# Patient Record
Sex: Female | Born: 1940
Health system: Southern US, Community
[De-identification: ages and names within clinical notes are randomized; demographics above are authoritative.]

## PROBLEM LIST (undated history)

## (undated) DIAGNOSIS — T7840XA Allergy, unspecified, initial encounter: Secondary | ICD-10-CM

## (undated) DIAGNOSIS — Z9889 Other specified postprocedural states: Secondary | ICD-10-CM

## (undated) DIAGNOSIS — R112 Nausea with vomiting, unspecified: Secondary | ICD-10-CM

## (undated) DIAGNOSIS — J449 Chronic obstructive pulmonary disease, unspecified: Secondary | ICD-10-CM

## (undated) DIAGNOSIS — K635 Polyp of colon: Secondary | ICD-10-CM

## (undated) DIAGNOSIS — I509 Heart failure, unspecified: Secondary | ICD-10-CM

## (undated) HISTORY — PX: THUMB ARTHROSCOPY: SHX2509

## (undated) HISTORY — DX: Allergy, unspecified, initial encounter: T78.40XA

## (undated) HISTORY — DX: Polyp of colon: K63.5

## (undated) HISTORY — PX: PARTIAL HYSTERECTOMY: SHX80

## (undated) HISTORY — PX: KNEE SURGERY: SHX244

---

## 2013-03-13 ENCOUNTER — Ambulatory Visit: Payer: Self-pay | Admitting: Ophthalmology

## 2015-01-09 ENCOUNTER — Ambulatory Visit
Admit: 2015-01-09 | Disposition: A | Discharge status: Home / Self Care | Payer: Self-pay | Attending: Hematology and Oncology | Admitting: Hematology and Oncology

## 2015-01-09 LAB — CBC CANCER CENTER
Bands: 1 %
Basophil #: 0 x10 3/mm (ref 0.0–0.1)
Basophil %: 0.5 %
Basophil: 1 %
Comment - H1-Com1: NORMAL
Eosinophil #: 0.3 x10 3/mm (ref 0.0–0.7)
Eosinophil %: 4.3 %
Eosinophil: 3 %
HCT: 43 % (ref 35.0–47.0)
HGB: 14.7 g/dL (ref 12.0–16.0)
Lymphocyte #: 2.2 x10 3/mm (ref 1.0–3.6)
Lymphocyte %: 34 %
Lymphocytes: 33 %
MCH: 33.3 pg (ref 26.0–34.0)
MCHC: 34.2 g/dL (ref 32.0–36.0)
MCV: 97 fL (ref 80–100)
Monocyte #: 0.5 x10 3/mm (ref 0.2–0.9)
Monocyte %: 8.3 %
Monocytes: 10 %
Neutrophil #: 3.4 x10 3/mm (ref 1.4–6.5)
Neutrophil %: 52.9 %
Platelet: 128 x10 3/mm — ABNORMAL LOW (ref 150–440)
RBC: 4.41 10*6/uL (ref 3.80–5.20)
RDW: 12.4 % (ref 11.5–14.5)
Segmented Neutrophils: 48 %
Variant Lymphocyte: 4 %
WBC: 6.4 x10 3/mm (ref 3.6–11.0)

## 2015-01-09 LAB — BASIC METABOLIC PANEL
Anion Gap: 7 (ref 7–16)
BUN: 12 mg/dL
Calcium, Total: 9.4 mg/dL
Chloride: 105 mmol/L
Co2: 28 mmol/L
Creatinine: 0.76 mg/dL
EGFR (African American): >60
EGFR (Non-African Amer.): >60
Glucose: 100 mg/dL — ABNORMAL HIGH
Potassium: 3.9 mmol/L
Sodium: 140 mmol/L

## 2015-01-09 LAB — PROTIME-INR
INR: 1
Prothrombin Time: 13.1 secs

## 2015-01-09 LAB — APTT: Activated PTT: 26.2 secs (ref 23.6–35.9)

## 2015-01-09 LAB — TSH: Thyroid Stimulating Horm: 3.494 u[IU]/mL

## 2015-01-09 LAB — FOLATE: Folic Acid: 19.8 ng/mL

## 2015-01-12 LAB — URINE IEP, RANDOM

## 2015-01-12 LAB — PROT IMMUNOELECTROPHORES(ARMC)

## 2015-01-14 ENCOUNTER — Other Ambulatory Visit: Payer: Self-pay | Admitting: Family Medicine

## 2015-01-14 DIAGNOSIS — Z1231 Encounter for screening mammogram for malignant neoplasm of breast: Secondary | ICD-10-CM

## 2015-02-03 ENCOUNTER — Ambulatory Visit: Payer: Self-pay

## 2015-02-03 ENCOUNTER — Ambulatory Visit
Admission: RE | Admit: 2015-02-03 | Discharge: 2015-02-03 | Disposition: A | Discharge status: Home / Self Care | Payer: Medicare Other | Source: Ambulatory Visit | Attending: Family Medicine | Admitting: Family Medicine

## 2015-02-03 DIAGNOSIS — Z1231 Encounter for screening mammogram for malignant neoplasm of breast: Secondary | ICD-10-CM | POA: Diagnosis present

## 2015-02-10 ENCOUNTER — Other Ambulatory Visit: Payer: Self-pay | Admitting: Family Medicine

## 2015-02-10 DIAGNOSIS — R928 Other abnormal and inconclusive findings on diagnostic imaging of breast: Secondary | ICD-10-CM

## 2015-02-12 ENCOUNTER — Ambulatory Visit: Payer: Medicare Other

## 2015-02-12 ENCOUNTER — Ambulatory Visit
Admission: RE | Admit: 2015-02-12 | Discharge: 2015-02-12 | Disposition: A | Discharge status: Home / Self Care | Payer: Medicare Other | Source: Ambulatory Visit | Attending: Family Medicine | Admitting: Family Medicine

## 2015-02-12 ENCOUNTER — Other Ambulatory Visit: Payer: Medicare Other

## 2015-02-12 DIAGNOSIS — R928 Other abnormal and inconclusive findings on diagnostic imaging of breast: Secondary | ICD-10-CM

## 2015-02-12 DIAGNOSIS — R922 Inconclusive mammogram: Secondary | ICD-10-CM | POA: Diagnosis present

## 2015-03-25 ENCOUNTER — Other Ambulatory Visit: Payer: Self-pay | Admitting: Family Medicine

## 2015-03-25 DIAGNOSIS — R9389 Abnormal findings on diagnostic imaging of other specified body structures: Secondary | ICD-10-CM

## 2015-03-25 DIAGNOSIS — Z720 Tobacco use: Secondary | ICD-10-CM

## 2015-04-01 ENCOUNTER — Ambulatory Visit
Admission: RE | Admit: 2015-04-01 | Discharge: 2015-04-01 | Disposition: A | Discharge status: Home / Self Care | Payer: Medicare Other | Source: Ambulatory Visit | Attending: Family Medicine | Admitting: Family Medicine

## 2015-04-01 DIAGNOSIS — J439 Emphysema, unspecified: Secondary | ICD-10-CM | POA: Insufficient documentation

## 2015-04-01 DIAGNOSIS — K760 Fatty (change of) liver, not elsewhere classified: Secondary | ICD-10-CM | POA: Insufficient documentation

## 2015-04-01 DIAGNOSIS — Z720 Tobacco use: Secondary | ICD-10-CM | POA: Diagnosis present

## 2015-04-01 DIAGNOSIS — R938 Abnormal findings on diagnostic imaging of other specified body structures: Secondary | ICD-10-CM | POA: Diagnosis present

## 2015-04-01 DIAGNOSIS — R9389 Abnormal findings on diagnostic imaging of other specified body structures: Secondary | ICD-10-CM

## 2015-04-02 DIAGNOSIS — R911 Solitary pulmonary nodule: Secondary | ICD-10-CM | POA: Insufficient documentation

## 2015-04-02 DIAGNOSIS — J432 Centrilobular emphysema: Secondary | ICD-10-CM | POA: Insufficient documentation

## 2015-04-02 DIAGNOSIS — K760 Fatty (change of) liver, not elsewhere classified: Secondary | ICD-10-CM | POA: Insufficient documentation

## 2015-04-16 ENCOUNTER — Other Ambulatory Visit: Payer: Self-pay

## 2015-04-16 DIAGNOSIS — D696 Thrombocytopenia, unspecified: Secondary | ICD-10-CM | POA: Insufficient documentation

## 2015-04-17 ENCOUNTER — Inpatient Hospital Stay: Payer: Medicare Other | Attending: Hematology and Oncology

## 2015-07-23 ENCOUNTER — Inpatient Hospital Stay: Payer: Medicare Other | Attending: Hematology and Oncology

## 2015-07-23 ENCOUNTER — Inpatient Hospital Stay: Payer: Medicare Other | Admitting: Hematology and Oncology

## 2016-02-26 ENCOUNTER — Ambulatory Visit: Payer: Medicare Other

## 2016-02-26 ENCOUNTER — Other Ambulatory Visit: Payer: Self-pay | Admitting: Family Medicine

## 2016-02-26 DIAGNOSIS — R51 Headache: Principal | ICD-10-CM

## 2016-02-26 DIAGNOSIS — R519 Headache, unspecified: Secondary | ICD-10-CM

## 2016-03-01 ENCOUNTER — Ambulatory Visit
Admission: RE | Admit: 2016-03-01 | Discharge: 2016-03-01 | Disposition: A | Discharge status: Home / Self Care | Payer: Medicare Other | Source: Ambulatory Visit | Attending: Family Medicine | Admitting: Family Medicine

## 2016-03-01 DIAGNOSIS — J329 Chronic sinusitis, unspecified: Secondary | ICD-10-CM | POA: Insufficient documentation

## 2016-03-01 DIAGNOSIS — R9089 Other abnormal findings on diagnostic imaging of central nervous system: Secondary | ICD-10-CM | POA: Diagnosis not present

## 2016-03-01 DIAGNOSIS — I6782 Cerebral ischemia: Secondary | ICD-10-CM | POA: Insufficient documentation

## 2016-03-01 DIAGNOSIS — R51 Headache: Secondary | ICD-10-CM | POA: Insufficient documentation

## 2016-03-01 DIAGNOSIS — R519 Headache, unspecified: Secondary | ICD-10-CM

## 2017-01-31 ENCOUNTER — Other Ambulatory Visit: Payer: Self-pay | Admitting: Family Medicine

## 2017-01-31 DIAGNOSIS — R911 Solitary pulmonary nodule: Secondary | ICD-10-CM

## 2017-01-31 DIAGNOSIS — J449 Chronic obstructive pulmonary disease, unspecified: Secondary | ICD-10-CM | POA: Insufficient documentation

## 2017-02-06 ENCOUNTER — Ambulatory Visit: Payer: Medicare Other

## 2017-09-26 ENCOUNTER — Telehealth: Payer: Self-pay | Admitting: Nurse Practitioner

## 2017-09-26 ENCOUNTER — Other Ambulatory Visit: Payer: Self-pay

## 2017-09-26 ENCOUNTER — Ambulatory Visit: Payer: Self-pay | Admitting: Nurse Practitioner

## 2017-09-26 ENCOUNTER — Encounter: Payer: Self-pay | Admitting: Nurse Practitioner

## 2017-09-26 VITALS — BP 117/60 | HR 68 | Temp 97.8°F | Resp 18 | Ht 66.5 in | Wt 177.2 lb

## 2017-09-26 DIAGNOSIS — Z1231 Encounter for screening mammogram for malignant neoplasm of breast: Secondary | ICD-10-CM | POA: Diagnosis not present

## 2017-09-26 DIAGNOSIS — Z7689 Persons encountering health services in other specified circumstances: Secondary | ICD-10-CM | POA: Diagnosis not present

## 2017-09-26 DIAGNOSIS — J410 Simple chronic bronchitis: Secondary | ICD-10-CM

## 2017-09-26 DIAGNOSIS — Z1239 Encounter for other screening for malignant neoplasm of breast: Secondary | ICD-10-CM

## 2017-09-26 MED ORDER — BUDESONIDE-FORMOTEROL FUMARATE 80-4.5 MCG/ACT IN AERO: 2.0000 | INHALATION_SPRAY | Freq: Two times a day (BID) | RESPIRATORY_TRACT | 3 refills | Status: DC

## 2017-09-26 MED ORDER — FLUTICASONE-SALMETEROL 100-50 MCG/DOSE IN AEPB: 1.0000 | INHALATION_SPRAY | Freq: Two times a day (BID) | RESPIRATORY_TRACT | 3 refills | Status: DC

## 2017-09-26 NOTE — Telephone Encounter (Signed)
Sent Advair instead.  Advair is tier 2 instead of tier 3 and is the lowest tier drug available for her.

## 2017-09-26 NOTE — Progress Notes (Signed)
Subjective:    Patient ID: Kristine Le, female    DOB: 1941/08/31, 77 y.o.   MRN: [id number]  Kristine Le is a 77 y.o. female presenting on 09/26/2017 for Establish Care [facility] clinic [place].chest congestion, coughing, ear fullness, and nasal congestion x 8 days )   HPI Establish Care New Provider Pt last seen by PCP at [facility] several months ago.  Obtain records from Care Everywhere.   Health maintenance No physical since 2015 Frequent bronchitis: CT scan nodules - scar tissue in past Mammogram: 2016 Colonoscopy: 2016 Last PAP: 1980s - but pt has had hysterectomy  Chest Congestion Sinus problems since her early 10s.  Pt reports chest congestion, cough, ear fullness, nasal congestion x last 8 days.  She is not currently having fever.   - Started mucinex and is improving some, but symptoms have continued to persist. - Stress incontinence is worse with coughing and she is not able to sleep well. - Pt has used albuterol inhaler with some relief, but continues to have chest tightness.  Past Medical History:  Diagnosis Date  . Allergy   . Colon polyps    Past Surgical History:  Procedure Laterality Date  . KNEE SURGERY    . PARTIAL HYSTERECTOMY    . THUMB ARTHROSCOPY     Social History   Socioeconomic History  . Marital status: Widowed    Spouse name: Not on file  . Number of children: Not on file  . Years of education: Not on file  . Highest education level: Not on file  Social Needs  . Financial resource strain: Not very hard  . Food insecurity - worry: Never true  . Food insecurity - inability: Never true  . Transportation needs - medical: No  . Transportation needs - non-medical: No  Occupational History  . Not on file  Tobacco Use  . Smoking status: Current Every Day Smoker    Packs/day: 1.00    Years: 60.00    Pack years: 60.00    Types: Cigarettes  . Smokeless tobacco: Current User  . Tobacco comment: she quit for 8 years in 1980s and would  like to quit.  Meds not effective in past.  Substance and Sexual Activity  . Alcohol use: Yes    Alcohol/week: 3.6 oz    Types: 6 Cans of beer per week    Comment: has liquor occasioanlly   . Drug use: No  . Sexual activity: Not on file  Other Topics Concern  . Not on file  Social History Narrative  . Not on file   Family History  Problem Relation Age of Onset  . Breast cancer Cousin        maternal  . Cancer Father        renal cancer  . Asthma Mother   . Arthritis Sister   . GER disease Sister   . Diabetes Sister    Current Outpatient Medications on File Prior to Visit  Medication Sig  . cetirizine (ZYRTEC) 10 MG tablet Take by mouth.  Marland Kitchen guaiFENesin (MUCINEX) 600 MG 12 hr tablet Take by mouth 2 (two) times daily.  Marland Kitchen ibuprofen (ADVIL,MOTRIN) 200 MG tablet Take by mouth.   No current facility-administered medications on file prior to visit.     Review of Systems  Constitutional: Negative.   HENT: Positive for congestion, sinus pressure and sinus pain.   Eyes: Negative.   Respiratory: Positive for cough, chest tightness, shortness of breath and wheezing.   Cardiovascular: Negative.  Gastrointestinal: Negative.   Endocrine: Negative.   Genitourinary: Negative.   Musculoskeletal: Negative.   Skin: Negative.   Allergic/Immunologic: Negative.   Neurological: Negative.   Hematological: Negative.   Psychiatric/Behavioral: Negative.    Per HPI unless specifically indicated above     Objective:    BP 117/60 (BP Location: Right Arm, Patient Position: Sitting, Cuff Size: Normal)   Pulse 68   Temp 97.8 F (36.6 C) (Oral)   Resp 18   Ht 5' 6.5" (1.689 m)   Wt 177 lb 3.2 oz (80.4 kg)   SpO2 99%   BMI 28.17 kg/m   Wt Readings from Last 3 Encounters:  10/10/17 179 lb 6.4 oz (81.4 kg)  09/26/17 177 lb 3.2 oz (80.4 kg)    Physical Exam  General - overweight, well-appearing, NAD HEENT - Normocephalic, atraumatic, PERRL, EOMI, patent nares w/o congestion, oropharynx  clear, MMM, TM normal, Ear canal normal, external ear normal w/o lesions, sinuses nontender to palpation Neck - supple, non-tender, no LAD, no thyromegaly, no carotid bruit Heart - RRR, no murmurs heard Lungs - Decreased air movement throughout all lobes, with wheezing and crackles, no rhonchi. Normal work of breathing, but difficult to take full deep breath without coughing. Extremeties - non-tender, no edema, cap refill < 2 seconds, peripheral pulses intact +2 bilaterally Skin - warm, dry Neuro - awake, alert, oriented x3, normal gait Psych - Normal mood and affect, normal behavior      Assessment & Plan:   Problem List Items Addressed This Visit    None    Visit Diagnoses    Simple chronic bronchitis (HCC)    -  Primary Consistent with URI and secondary bronchitis with symptoms improving.  Now has lingering cough and is likely chronic bronchitis w/ pt report of regular bronchitis each year in smoker of 52 pack-year smoking history.   Plan: 1.No antibiotic indicated w/ recent improvement of symptoms. - Start Mucinex-DM OTC for  7-10 days prn congestion - START symbicort 2 puffs twice daily - Continue albuterol as needed for rescue inhaler 2. Supportive care with nasal saline, warm herbal tea w/ honey for sore throat and URI symptoms. 3. Improve hydration 4. Tylenol / Motrin PRN fevers  5. Return criteria given    Breast cancer screening     Pt last mammogram > 2 years ago.  Plan: 1. Screening mammogram order placed.  Pt will call to schedule appointment.  Information given.    Encounter to establish care     [facility].  Records are reviewed in Care Everywhere.  Past medical, family, and surgical history reviewed w/ pt in clinic today.       Meds ordered this encounter  Medications  . DISCONTD: budesonide-formoterol (SYMBICORT) 80-4.5 MCG/ACT inhaler    Sig: Inhale 2 puffs into the lungs 2 (two) times daily.    Dispense:  1  Inhaler    Refill:  3    Order Specific Question:   Supervising Provider    Answer:   Smitty CordsKARAMALEGOS, ALEXANDER J [[id number]]      Follow up plan: Return for medicare wellness with Tiffany, 3 months with Elane Peabody for COPD.  Kristine McardleLauren Kaziah Krizek, DNP, AGPCNP-BC Adult Gerontology Primary Care Nurse Practitioner [facility] 10/13/2017, 1:27 PM

## 2017-09-26 NOTE — Telephone Encounter (Signed)
Pt said inhaler that was called in will cost her $40.  She asked to have something else called instead.  Her call back number is [phone]

## 2017-09-26 NOTE — Telephone Encounter (Signed)
The pt was notified. No questions or concerns. 

## 2017-09-26 NOTE — Patient Instructions (Addendum)
Kristine Le, Thank you for coming in to clinic today.  1. Your mammogram order has been placed.  Call the Scheduling phone number at [phone] to schedule your mammogram at your convenience.  You can choose to go to either location listed below.  Let the scheduler know which location you prefer.  [facility] [place] [facility] [address] [place], [place] [address] [place], [place] [id number]   2. For your foot: continue your epsom salt foot soaks in warm (not hot) water.  - IF this worsens, we can send you to podiatry.  3. You also have bronchitis today.  MOST Likely only related to your COPD.  - Continue mucinex or mucinex DM (for cough) twice daily as needed. - START symbicort 2 puffs twice daily. - Continue albuterol 1-2 puffs every 6 hours as needed for coughing or shortness of breath.  Please schedule a follow-up appointment with Wilhelmina McardleLauren Devon Pretty, AGNP. Return for medicare wellness with Tiffany, 3 months with [name] for COPD.  If you have any other questions or concerns, please feel free to call the clinic or send a message through MyChart. You may also schedule an earlier appointment if necessary.  You will receive a survey after today's visit either digitally by e-mail or paper by [employer] mail. Your experiences and feedback matter to us.  Please respond so we know how we are doing as we provide care for you.   Wilhelmina McardleLauren Exodus Kutzer, DNP, AGNP-BC Adult Gerontology Nurse Practitioner [facility], [facility]

## 2017-10-10 ENCOUNTER — Ambulatory Visit (INDEPENDENT_AMBULATORY_CARE_PROVIDER_SITE_OTHER): Payer: Medicare Other

## 2017-10-10 ENCOUNTER — Telehealth: Payer: Self-pay | Admitting: *Deleted

## 2017-10-10 VITALS — BP 140/82 | HR 70 | Temp 98.0°F | Resp 16 | Ht 67.0 in | Wt 179.4 lb

## 2017-10-10 DIAGNOSIS — Z0000 Encounter for general adult medical examination without abnormal findings: Secondary | ICD-10-CM | POA: Diagnosis not present

## 2017-10-10 DIAGNOSIS — Z87891 Personal history of nicotine dependence: Secondary | ICD-10-CM

## 2017-10-10 DIAGNOSIS — Z1231 Encounter for screening mammogram for malignant neoplasm of breast: Secondary | ICD-10-CM | POA: Diagnosis not present

## 2017-10-10 DIAGNOSIS — Z1239 Encounter for other screening for malignant neoplasm of breast: Secondary | ICD-10-CM

## 2017-10-10 DIAGNOSIS — Z122 Encounter for screening for malignant neoplasm of respiratory organs: Secondary | ICD-10-CM

## 2017-10-10 NOTE — Progress Notes (Signed)
Subjective:   Mariadelcarmen Corella is a 77 y.o. female who presents for Medicare Annual (Subsequent) preventive examination.  Review of Systems:   Cardiac Risk Factors include: advanced age (>2men, >47 women)     Objective:     Vitals: BP 140/82 (BP Location: Left Arm, Patient Position: Sitting)   Pulse 70   Temp 98 F (36.7 C) (Oral)   Resp 16   Ht 5\' 7"  (1.702 m)   Wt 179 lb 6.4 oz (81.4 kg)   BMI 28.10 kg/m   Body mass index is 28.1 kg/m.  Advanced Directives 10/10/2017  Does Patient Have a Medical Advance Directive? No  Would patient like information on creating a medical advance directive? Yes (MAU/Ambulatory/Procedural Areas - Information given)    Tobacco Social History   Tobacco Use  Smoking Status Current Every Day Smoker  . Packs/day: 1.00  . Years: 60.00  . Pack years: 60.00  . Types: Cigarettes  Smokeless Tobacco Current User     Ready to quit: No Counseling given: Yes   Clinical Intake:  Pre-visit preparation completed: Yes  Pain : No/denies pain     Nutritional Status: BMI 25 -29 Overweight Nutritional Risks: None Diabetes: No  How often do you need to have someone help you when you read instructions, pamphlets, or other written materials from your doctor or pharmacy?: 1 - Never What is the last grade level you completed in school?: high school   Interpreter Needed?: No  Information entered by :: Tiffany HIll,LPN   Past Medical History:  Diagnosis Date  . Allergy   . Colon polyps    Past Surgical History:  Procedure Laterality Date  . KNEE SURGERY    . PARTIAL HYSTERECTOMY    . THUMB ARTHROSCOPY     Family History  Problem Relation Age of Onset  . Breast cancer Cousin        maternal  . Cancer Father        renal cancer   Social History   Socioeconomic History  . Marital status: Widowed    Spouse name: None  . Number of children: None  . Years of education: None  . Highest education level: None  Social Needs  .  Financial resource strain: Not very hard  . Food insecurity - worry: Never true  . Food insecurity - inability: Never true  . Transportation needs - medical: No  . Transportation needs - non-medical: No  Occupational History  . None  Tobacco Use  . Smoking status: Current Every Day Smoker    Packs/day: 1.00    Years: 60.00    Pack years: 60.00    Types: Cigarettes  . Smokeless tobacco: Current User  Substance and Sexual Activity  . Alcohol use: Yes    Alcohol/week: 3.6 oz    Types: 6 Cans of beer per week    Comment: has liquor occasioanlly   . Drug use: No  . Sexual activity: None  Other Topics Concern  . None  Social History Narrative  . None    Outpatient Encounter Medications as of 10/10/2017  Medication Sig  . cetirizine (ZYRTEC) 10 MG tablet Take by mouth.  Marland Kitchen guaiFENesin (MUCINEX) 600 MG 12 hr tablet Take by mouth 2 (two) times daily.  Marland Kitchen ibuprofen (ADVIL,MOTRIN) 200 MG tablet Take by mouth.  . Fluticasone-Salmeterol (ADVAIR) 100-50 MCG/DOSE AEPB Inhale 1 puff into the lungs 2 (two) times daily. (Patient not taking: Reported on 10/10/2017)  . [DISCONTINUED] FLUAD 0.5 ML SUSY ADM 0.5ML  IM UTD  . [DISCONTINUED] Phenyleph-CPM-DM-APAP (ALKA-SELTZER PLUS COLD & COUGH) 01-18-09-325 MG CAPS Take by mouth.   No facility-administered encounter medications on file as of 10/10/2017.     Activities of Daily Living In your present state of health, do you have any difficulty performing the following activities: 10/10/2017  Hearing? N  Vision? N  Difficulty concentrating or making decisions? N  Walking or climbing stairs? N  Dressing or bathing? N  Doing errands, shopping? N  Preparing Food and eating ? N  Using the Toilet? N  In the past six months, have you accidently leaked urine? Y  Comment wears depends   Do you have problems with loss of bowel control? N  Managing your Medications? N  Managing your Finances? N  Housekeeping or managing your Housekeeping? N  Some recent  data might be hidden    Patient Care Team: Galen ManilaKennedy, Lauren Renee, NP as PCP - General (Nurse Practitioner)    Assessment:   This is a routine wellness examination for CraigmontLinda.  Exercise Activities and Dietary recommendations Current Exercise Habits: The patient does not participate in regular exercise at present, Exercise limited by: None identified  Goals    . Exercise 3x per week (30 min per time)     Recommend walking 10-15 min 3x a week     . Quit Smoking     Smoking cessation discussed       Fall Risk Fall Risk  10/10/2017  Falls in the past year? No   Is the patient's home free of loose throw rugs in walkways, pet beds, electrical cords, etc?   yes      Grab bars in the bathroom? yes      Handrails on the stairs?   yes      Adequate lighting?   yes  Timed Get Up and Go performed: completed in 8 seconds with no use of assistive devices. Steady gait. No intervention needed at this time.   Depression Screen PHQ 2/9 Scores 10/10/2017 09/26/2017  PHQ - 2 Score 0 0  PHQ- 9 Score - 0     Cognitive Function        Immunization History  Administered Date(s) Administered  . Influenza Split 06/24/2014  . Influenza-Unspecified 06/07/2012, 06/24/2015, 06/27/2017  . Pneumococcal Polysaccharide-23 06/24/2014   Found records for pneumovax 23 injection. Will check [facility] for prevnar 13. [place] [facility]   Qualifies for Shingles Vaccine?discussed shingrix vaccine   Screening Tests Health Maintenance  Topic Date Due  . PNA vac Low Risk Adult (2 of 2 - PPSV23) 06/25/2015  . DEXA SCAN  10/10/2018 (Originally 10/09/2007)  . TETANUS/TDAP  10/10/2018 (Originally 10/08/1961)  . COLONOSCOPY  05/26/2025  . INFLUENZA VACCINE  Completed    Cancer Screenings: Lung: Low Dose CT Chest recommended if Age 20-80 years, 30 pack-year currently smoking OR have quit w/in 15years. Patient does qualify.in-basket message sent to Glenna FellowsShawn perkins, RN- Oncology Navigator. Breast:  Up to date  on Mammogram? No  ordered Up to date of Bone Density/Dexa? No, declined Colorectal: completed 05/27/2015  Additional Screenings:  Hepatitis B/HIV/Syphillis: not indicated  Hepatitis C Screening: not indicated     Plan:    I have personally reviewed and addressed the Medicare Annual Wellness questionnaire and have noted the following in the patient's chart:  A. Medical and social history B. Use of alcohol, tobacco or illicit drugs  C. Current medications and supplements D. Functional ability and status E.  Nutritional status F.  Physical activity G. Advance directives H. List of other physicians I.  Hospitalizations, surgeries, and ER visits in previous 12 months J.  Vitals K. Screenings such as hearing and vision if needed, cognitive and depression L. Referrals and appointments   In addition, I have reviewed and discussed with patient certain preventive protocols, quality metrics, and best practice recommendations. A written personalized care plan for preventive services as well as general preventive health recommendations were provided to patient.   Signed,  Marin Roberts, LPN Nurse Health Advisor   Nurse Notes: none

## 2017-10-10 NOTE — Telephone Encounter (Signed)
Received referral for initial lung cancer screening scan. Contacted patient and obtained smoking history,(current, 52 pack year) as well as answering questions related to screening process. Patient denies signs of lung cancer such as weight loss or hemoptysis. Patient denies comorbidity that would prevent curative treatment if lung cancer were found. Patient is scheduled for shared decision making visit and CT scan on 10/18/17.

## 2017-10-10 NOTE — Patient Instructions (Signed)
[name] , Thank you for taking time to come for your Medicare Wellness Visit. I appreciate your ongoing commitment to your health goals. Please review the following plan we discussed and let me know if I can assist you in the future.   Screening recommendations/referrals: Colonoscopy: completed, will obtain records Mammogram: Please call [phone] to schedule your mammogram.  Bone Density: Please call [phone] to schedule  Recommended yearly ophthalmology/optometry visit for glaucoma screening and checkup Recommended yearly dental visit for hygiene and checkup  Vaccinations: Influenza vaccine: up to date Pneumococcal vaccine: pneumovax 23 done.  Tdap vaccine: due, check with your insurance company for coverage Shingles vaccine:  due, check with your insurance company for coverage  Advanced directives: Please bring a copy of your health care power of attorney and living will to the office at your convenience.  Conditions/risks identified: Smoking cessation discussed. Recommend walking 10-15 min 3x a week   Next appointment:follow up on 12/25/2017 at 8;20am with Elly ModenaLauren Kennedy,NP.  Follow up in one year for your annual wellness exam   Preventive Care 65 Years and Older, Female Preventive care refers to lifestyle choices and visits with your health care provider that can promote health and wellness. What does preventive care include?  A yearly physical exam. This is also called an annual well check.  Dental exams once or twice a year.  Routine eye exams. Ask your health care provider how often you should have your eyes checked.  Personal lifestyle choices, including:  Daily care of your teeth and gums.  Regular physical activity.  Eating a healthy diet.  Avoiding tobacco and drug use.  Limiting alcohol use.  Practicing safe sex.  Taking low-dose aspirin every day.  Taking vitamin and mineral supplements as recommended by your health care provider. What happens  during an annual well check? The services and screenings done by your health care provider during your annual well check will depend on your age, overall health, lifestyle risk factors, and family history of disease. Counseling  Your health care provider may ask you questions about your:  Alcohol use.  Tobacco use.  Drug use.  Emotional well-being.  Home and relationship well-being.  Sexual activity.  Eating habits.  History of falls.  Memory and ability to understand (cognition).  Work and work Astronomerenvironment.  Reproductive health. Screening  You may have the following tests or measurements:  Height, weight, and BMI.  Blood pressure.  Lipid and cholesterol levels. These may be checked every 5 years, or more frequently if you are over 75 years old.  Skin check.  Lung cancer screening. You may have this screening every year starting at age 75 if you have a 30-pack-year history of smoking and currently smoke or have quit within the past 15 years.  Fecal occult blood test (FOBT) of the stool. You may have this test every year starting at age 75.  Flexible sigmoidoscopy or colonoscopy. You may have a sigmoidoscopy every 5 years or a colonoscopy every 10 years starting at age 75.  Hepatitis C blood test.  Hepatitis B blood test.  Sexually transmitted disease (STD) testing.  Diabetes screening. This is done by checking your blood sugar (glucose) after you have not eaten for a while (fasting). You may have this done every 1-3 years.  Bone density scan. This is done to screen for osteoporosis. You may have this done starting at age 75.  Mammogram. This may be done every 1-2 years. Talk to your health care provider about how often  you should have regular mammograms. Talk with your health care provider about your test results, treatment options, and if necessary, the need for more tests. Vaccines  Your health care provider may recommend certain vaccines, such  as:  Influenza vaccine. This is recommended every year.  Tetanus, diphtheria, and acellular pertussis (Tdap, Td) vaccine. You may need a Td booster every 10 years.  Zoster vaccine. You may need this after age 62.  Pneumococcal 13-valent conjugate (PCV13) vaccine. One dose is recommended after age 4.  Pneumococcal polysaccharide (PPSV23) vaccine. One dose is recommended after age 79. Talk to your health care provider about which screenings and vaccines you need and how often you need them. This information is not intended to replace advice given to you by your health care provider. Make sure you discuss any questions you have with your health care provider. Document Released: 10/02/2015 Document Revised: 05/25/2016 Document Reviewed: 07/07/2015 Elsevier Interactive Patient Education  2017 [place] Prevention in the Home Falls can cause injuries. They can happen to people of all ages. There are many things you can do to make your home safe and to help prevent falls. What can I do on the outside of my home?  Regularly fix the edges of walkways and driveways and fix any cracks.  Remove anything that might make you trip as you walk through a door, such as a raised step or threshold.  Trim any bushes or trees on the path to your home.  Use bright outdoor lighting.  Clear any walking paths of anything that might make someone trip, such as rocks or tools.  Regularly check to see if handrails are loose or broken. Make sure that both sides of any steps have handrails.  Any raised decks and porches should have guardrails on the edges.  Have any leaves, snow, or ice cleared regularly.  Use sand or salt on walking paths during winter.  Clean up any spills in your garage right away. This includes oil or grease spills. What can I do in the bathroom?  Use night lights.  Install grab bars by the toilet and in the tub and shower. Do not use towel bars as grab bars.  Use  non-skid mats or decals in the tub or shower.  If you need to sit down in the shower, use a plastic, non-slip stool.  Keep the floor dry. Clean up any water that spills on the floor as soon as it happens.  Remove soap buildup in the tub or shower regularly.  Attach bath mats securely with double-sided non-slip rug tape.  Do not have throw rugs and other things on the floor that can make you trip. What can I do in the bedroom?  Use night lights.  Make sure that you have a light by your bed that is easy to reach.  Do not use any sheets or blankets that are too big for your bed. They should not hang down onto the floor.  Have a firm chair that has side arms. You can use this for support while you get dressed.  Do not have throw rugs and other things on the floor that can make you trip. What can I do in the kitchen?  Clean up any spills right away.  Avoid walking on wet floors.  Keep items that you use a lot in easy-to-reach places.  If you need to reach something above you, use a strong step stool that has a grab bar.  Keep electrical cords  out of the way.  Do not use floor polish or wax that makes floors slippery. If you must use wax, use non-skid floor wax.  Do not have throw rugs and other things on the floor that can make you trip. What can I do with my stairs?  Do not leave any items on the stairs.  Make sure that there are handrails on both sides of the stairs and use them. Fix handrails that are broken or loose. Make sure that handrails are as long as the stairways.  Check any carpeting to make sure that it is firmly attached to the stairs. Fix any carpet that is loose or worn.  Avoid having throw rugs at the top or bottom of the stairs. If you do have throw rugs, attach them to the floor with carpet tape.  Make sure that you have a light switch at the top of the stairs and the bottom of the stairs. If you do not have them, ask someone to add them for you. What  else can I do to help prevent falls?  Wear shoes that:  Do not have high heels.  Have rubber bottoms.  Are comfortable and fit you well.  Are closed at the toe. Do not wear sandals.  If you use a stepladder:  Make sure that it is fully opened. Do not climb a closed stepladder.  Make sure that both sides of the stepladder are locked into place.  Ask someone to hold it for you, if possible.  Clearly mark and make sure that you can see:  Any grab bars or handrails.  First and last steps.  Where the edge of each step is.  Use tools that help you move around (mobility aids) if they are needed. These include:  Canes.  Walkers.  Scooters.  Crutches.  Turn on the lights when you go into a dark area. Replace any light bulbs as soon as they burn out.  Set up your furniture so you have a clear path. Avoid moving your furniture around.  If any of your floors are uneven, fix them.  If there are any pets around you, be aware of where they are.  Review your medicines with your doctor. Some medicines can make you feel dizzy. This can increase your chance of falling. Ask your doctor what other things that you can do to help prevent falls. This information is not intended to replace advice given to you by your health care provider. Make sure you discuss any questions you have with your health care provider. Document Released: 07/02/2009 Document Revised: 02/11/2016 Document Reviewed: 10/10/2014 Elsevier Interactive Patient Education  2017 [employer].

## 2017-10-13 ENCOUNTER — Encounter: Payer: Self-pay | Admitting: Nurse Practitioner

## 2017-10-13 DIAGNOSIS — J410 Simple chronic bronchitis: Secondary | ICD-10-CM | POA: Insufficient documentation

## 2017-10-16 ENCOUNTER — Encounter: Payer: Self-pay | Admitting: Nurse Practitioner

## 2017-10-18 ENCOUNTER — Inpatient Hospital Stay: Payer: Medicare Other | Attending: Nurse Practitioner | Admitting: Nurse Practitioner

## 2017-10-18 ENCOUNTER — Ambulatory Visit
Admission: RE | Admit: 2017-10-18 | Discharge: 2017-10-18 | Disposition: A | Discharge status: Home / Self Care | Payer: Medicare Other | Source: Ambulatory Visit | Attending: Nurse Practitioner | Admitting: Nurse Practitioner

## 2017-10-18 DIAGNOSIS — Z87891 Personal history of nicotine dependence: Secondary | ICD-10-CM | POA: Diagnosis not present

## 2017-10-18 DIAGNOSIS — K760 Fatty (change of) liver, not elsewhere classified: Secondary | ICD-10-CM | POA: Diagnosis not present

## 2017-10-18 DIAGNOSIS — I2510 Atherosclerotic heart disease of native coronary artery without angina pectoris: Secondary | ICD-10-CM | POA: Insufficient documentation

## 2017-10-18 DIAGNOSIS — J432 Centrilobular emphysema: Secondary | ICD-10-CM | POA: Diagnosis not present

## 2017-10-18 DIAGNOSIS — R918 Other nonspecific abnormal finding of lung field: Secondary | ICD-10-CM | POA: Insufficient documentation

## 2017-10-18 DIAGNOSIS — F17210 Nicotine dependence, cigarettes, uncomplicated: Secondary | ICD-10-CM | POA: Diagnosis not present

## 2017-10-18 DIAGNOSIS — Z122 Encounter for screening for malignant neoplasm of respiratory organs: Secondary | ICD-10-CM | POA: Diagnosis not present

## 2017-10-18 DIAGNOSIS — I700 Atherosclerosis of aorta: Secondary | ICD-10-CM | POA: Insufficient documentation

## 2017-10-18 DIAGNOSIS — J438 Other emphysema: Secondary | ICD-10-CM | POA: Diagnosis not present

## 2017-10-18 NOTE — Progress Notes (Signed)
In accordance with CMS guidelines, patient has met eligibility criteria including age, absence of signs or symptoms of lung cancer.  Social History   Tobacco Use  . Smoking status: Current Every Day Smoker    Packs/day: 1.00    Years: 52.00    Pack years: 52.00    Types: Cigarettes  . Smokeless tobacco: Current User  . Tobacco comment: she quit for 8 years in 1980s and would like to quit.  Meds not effective in past.  Substance Use Topics  . Alcohol use: Yes    Alcohol/week: 3.6 oz    Types: 6 Cans of beer per week    Comment: has liquor occasioanlly   . Drug use: No     A shared decision-making session was conducted prior to the performance of CT scan. This includes one or more decision aids, includes benefits and harms of screening, follow-up diagnostic testing, over-diagnosis, false positive rate, and total radiation exposure.  Counseling on the importance of adherence to annual lung cancer LDCT screening, impact of co-morbidities, and ability or willingness to undergo diagnosis and treatment is imperative for compliance of the program.  Counseling on the importance of continued smoking cessation for former smokers; the importance of smoking cessation for current smokers, and information about tobacco cessation interventions have been given to patient including [place] and 1800 quit [place] programs.  Written order for lung cancer screening with LDCT has been given to the patient and any and all questions have been answered to the best of my abilities.   Yearly follow up will be coordinated by Burgess Estelle, Thoracic Navigator.  Beckey Rutter, DNP, AGNP-C [place] [facility] [phone] (office) 10/18/17 4:53 PM

## 2017-10-20 ENCOUNTER — Encounter: Payer: Self-pay | Admitting: *Deleted

## 2017-11-14 DIAGNOSIS — N6489 Other specified disorders of breast: Secondary | ICD-10-CM | POA: Insufficient documentation

## 2017-12-25 ENCOUNTER — Ambulatory Visit: Payer: Self-pay | Admitting: Nurse Practitioner

## 2017-12-27 ENCOUNTER — Other Ambulatory Visit: Payer: Self-pay

## 2017-12-27 ENCOUNTER — Telehealth: Payer: Self-pay | Admitting: Nurse Practitioner

## 2017-12-27 ENCOUNTER — Ambulatory Visit: Payer: Medicare Other | Admitting: Nurse Practitioner

## 2017-12-27 ENCOUNTER — Encounter: Payer: Self-pay | Admitting: Nurse Practitioner

## 2017-12-27 VITALS — BP 139/57 | HR 70 | Temp 98.0°F | Ht 67.0 in | Wt 176.4 lb

## 2017-12-27 DIAGNOSIS — Z780 Asymptomatic menopausal state: Secondary | ICD-10-CM

## 2017-12-27 DIAGNOSIS — Z136 Encounter for screening for cardiovascular disorders: Secondary | ICD-10-CM

## 2017-12-27 DIAGNOSIS — Z13220 Encounter for screening for lipoid disorders: Secondary | ICD-10-CM | POA: Diagnosis not present

## 2017-12-27 DIAGNOSIS — Z1329 Encounter for screening for other suspected endocrine disorder: Secondary | ICD-10-CM | POA: Diagnosis not present

## 2017-12-27 DIAGNOSIS — Z23 Encounter for immunization: Secondary | ICD-10-CM | POA: Diagnosis not present

## 2017-12-27 DIAGNOSIS — Z1231 Encounter for screening mammogram for malignant neoplasm of breast: Secondary | ICD-10-CM

## 2017-12-27 DIAGNOSIS — Z130 Encounter for screening for diseases of the blood and blood-forming organs and certain disorders involving the immune mechanism: Secondary | ICD-10-CM | POA: Diagnosis not present

## 2017-12-27 DIAGNOSIS — Z0000 Encounter for general adult medical examination without abnormal findings: Secondary | ICD-10-CM

## 2017-12-27 DIAGNOSIS — Z131 Encounter for screening for diabetes mellitus: Secondary | ICD-10-CM | POA: Diagnosis not present

## 2017-12-27 DIAGNOSIS — Z1239 Encounter for other screening for malignant neoplasm of breast: Secondary | ICD-10-CM

## 2017-12-27 DIAGNOSIS — Z862 Personal history of diseases of the blood and blood-forming organs and certain disorders involving the immune mechanism: Secondary | ICD-10-CM

## 2017-12-27 LAB — LIPID PANEL
Cholesterol: 166 mg/dL
HDL: 33 mg/dL — ABNORMAL LOW
LDL Cholesterol (Calc): 95 mg/dL
Non-HDL Cholesterol (Calc): 133 mg/dL — ABNORMAL HIGH
Total CHOL/HDL Ratio: 5 (calc) — ABNORMAL HIGH
Triglycerides: 264 mg/dL — ABNORMAL HIGH

## 2017-12-27 LAB — CBC WITH DIFFERENTIAL/PLATELET
Basophils Absolute: 20 {cells}/uL (ref 0–200)
Basophils Relative: 0.3 %
Eosinophils Absolute: 231 {cells}/uL (ref 15–500)
Eosinophils Relative: 3.5 %
HCT: 44.3 % (ref 35.0–45.0)
Hemoglobin: 14.9 g/dL (ref 11.7–15.5)
Lymphs Abs: 2482 {cells}/uL (ref 850–3900)
MCH: 32.9 pg (ref 27.0–33.0)
MCHC: 33.6 g/dL (ref 32.0–36.0)
MCV: 97.8 fL (ref 80.0–100.0)
MPV: 11.8 fL (ref 7.5–12.5)
Monocytes Relative: 8.5 %
Neutro Abs: 3307 {cells}/uL (ref 1500–7800)
Neutrophils Relative %: 50.1 %
Platelets: 136 10*3/uL — ABNORMAL LOW (ref 140–400)
RBC: 4.53 Million/uL (ref 3.80–5.10)
RDW: 12.4 % (ref 11.0–15.0)
Total Lymphocyte: 37.6 %
WBC mixed population: 561 {cells}/uL (ref 200–950)
WBC: 6.6 10*3/uL (ref 3.8–10.8)

## 2017-12-27 LAB — TSH: TSH: 3.36 m[IU]/L (ref 0.40–4.50)

## 2017-12-27 LAB — COMPLETE METABOLIC PANEL WITH GFR
AG Ratio: 2.2 (calc) (ref 1.0–2.5)
ALT: 33 U/L — ABNORMAL HIGH (ref 6–29)
AST: 27 U/L (ref 10–35)
Albumin: 4.6 g/dL (ref 3.6–5.1)
Alkaline phosphatase (APISO): 57 U/L (ref 33–130)
BUN: 12 mg/dL (ref 7–25)
CO2: 30 mmol/L (ref 20–32)
Calcium: 9.5 mg/dL (ref 8.6–10.4)
Chloride: 104 mmol/L (ref 98–110)
Creat: 0.72 mg/dL (ref 0.60–0.93)
GFR, Est African American: 95 mL/min/{1.73_m2} (ref >=60)
GFR, Est Non African American: 82 mL/min/{1.73_m2} (ref >=60)
Globulin: 2.1 g/dL (calc) (ref 1.9–3.7)
Glucose, Bld: 97 mg/dL (ref 65–99)
Potassium: 4.7 mmol/L (ref 3.5–5.3)
Sodium: 140 mmol/L (ref 135–146)
Total Bilirubin: 0.6 mg/dL (ref 0.2–1.2)
Total Protein: 6.7 g/dL (ref 6.1–8.1)

## 2017-12-27 NOTE — Progress Notes (Signed)
Subjective:    Patient ID: Kristine Le, female    DOB: May 12, 1941, 77 y.o.   MRN: [id number]  Kristine Le is a 77 y.o. female presenting on 12/27/2017 for Annual Exam   HPI Annual Physical Exam Patient has been feeling well.  They have no acute concerns today.   HEALTH MAINTENANCE: Weight/BMI: stable Physical activity: none regularly Diet: high fat, high carb, fried foods regularly 1x per week and with seafood Seatbelt: always Sunscreen: rarely PAP: - hysterectomy Mammogram: Due DEXA: due Colon Cancer Screen: due 2026 Optometry: due in October Dentistry: Dr. Jerelyn Scott - cleanings 2x per year  VACCINES: Tetanus: declined - no cuts today Influenza: 06/2017 Pneumonia: Due for prevnar 13 Shingles: due, but pt will get at pharmacy  Past Medical History:  Diagnosis Date  . Allergy   . Colon polyps    Past Surgical History:  Procedure Laterality Date  . KNEE SURGERY    . PARTIAL HYSTERECTOMY    . THUMB ARTHROSCOPY     Social History   Socioeconomic History  . Marital status: Widowed    Spouse name: Not on file  . Number of children: Not on file  . Years of education: Not on file  . Highest education level: Not on file  Occupational History  . Not on file  Social Needs  . Financial resource strain: Not very hard  . Food insecurity:    Worry: Never true    Inability: Never true  . Transportation needs:    Medical: No    Non-medical: No  Tobacco Use  . Smoking status: Current Every Day Smoker    Packs/day: 1.00    Years: 52.00    Pack years: 52.00    Types: Cigarettes  . Smokeless tobacco: Current User  . Tobacco comment: she quit for 8 years in 1980s and would like to quit.  Meds not effective in past.  Substance and Sexual Activity  . Alcohol use: Yes    Alcohol/week: 3.6 oz    Types: 6 Cans of beer per week    Comment: has liquor occasioanlly   . Drug use: No  . Sexual activity: Not on file  Lifestyle  . Physical activity:    Days per week: 0 days     Minutes per session: 0 min  . Stress: Not at all  Relationships  . Social connections:    Talks on phone: More than three times a week    Gets together: Three times a week    Attends religious service: More than 4 times per year    Active member of club or organization: No    Attends meetings of clubs or organizations: Never    Relationship status: Widowed  . Intimate partner violence:    Fear of current or ex partner: No    Emotionally abused: No    Physically abused: No    Forced sexual activity: No  Other Topics Concern  . Not on file  Social History Narrative  . Not on file   Family History  Problem Relation Age of Onset  . Breast cancer Cousin        maternal  . Cancer Father        renal cancer  . Asthma Mother   . Arthritis Sister   . GER disease Sister   . Diabetes Sister    Current Outpatient Medications on File Prior to Visit  Medication Sig  . cetirizine (ZYRTEC) 10 MG tablet Take by mouth.  . Cholecalciferol (  VITAMIN D3) 1000 units CAPS Take by mouth.  . Misc Natural Products (TURMERIC CURCUMIN) CAPS Take by mouth.  Marland Kitchen ibuprofen (ADVIL,MOTRIN) 200 MG tablet Take by mouth.   No current facility-administered medications on file prior to visit.     Review of Systems  Constitutional: Negative for chills and fever.  HENT: Negative for congestion and sore throat.   Eyes: Negative for pain.  Respiratory: Negative for cough, shortness of breath and wheezing.   Cardiovascular: Negative for chest pain, palpitations and leg swelling.  Gastrointestinal: Negative for abdominal pain, blood in stool, constipation, diarrhea, nausea and vomiting.  Endocrine: Negative for polydipsia.  Genitourinary: Negative for dysuria, frequency, hematuria and urgency.  Musculoskeletal: Negative for back pain, myalgias and neck pain.  Skin: Negative.  Negative for rash.  Allergic/Immunologic: Negative for environmental allergies.  Neurological: Negative for dizziness, weakness and  headaches.  Hematological: Does not bruise/bleed easily.  Psychiatric/Behavioral: Negative for dysphoric mood and suicidal ideas. The patient is not nervous/anxious.    Per HPI unless specifically indicated above     Objective:    BP (!) 139/57 (BP Location: Right Arm, Patient Position: Sitting, Cuff Size: Normal)   Pulse 70   Temp 98 F (36.7 C) (Oral)   Ht 5\' 7"  (1.702 m)   Wt 176 lb 6.4 oz (80 kg)   BMI 27.63 kg/m   Wt Readings from Last 3 Encounters:  12/27/17 176 lb 6.4 oz (80 kg)  10/18/17 176 lb (79.8 kg)  10/10/17 179 lb 6.4 oz (81.4 kg)    Physical Exam  Constitutional: She is oriented to person, place, and time. Vital signs are normal. She appears well-developed and well-nourished. She is active and cooperative. No distress.  BP (!) 139/57 (BP Location: Right Arm, Patient Position: Sitting, Cuff Size: Normal)   Pulse 70   Temp 98 F (36.7 C) (Oral)   Ht 5\' 7"  (1.702 m)   Wt 176 lb 6.4 oz (80 kg)   BMI 27.63 kg/m    HENT:  Head: Normocephalic and atraumatic.  Right Ear: Hearing, tympanic membrane, external ear and ear canal normal. No foreign bodies.  Left Ear: Hearing, tympanic membrane, external ear and ear canal normal. No foreign bodies.  Nose: Nose normal.  Mouth/Throat: Uvula is midline, oropharynx is clear and moist and mucous membranes are normal. No oral lesions. Normal dentition. No dental abscesses or uvula swelling. No oropharyngeal exudate.  Eyes: Pupils are equal, round, and reactive to light. Conjunctivae, EOM and lids are normal. Right eye exhibits no discharge. Left eye exhibits no discharge. No scleral icterus.  Fundoscopic exam:      The right eye shows no arteriolar narrowing, no AV nicking, no exudate, no hemorrhage and no papilledema. The right eye shows red reflex.       The left eye shows no arteriolar narrowing, no AV nicking, no exudate, no hemorrhage and no papilledema. The left eye shows red reflex.  Neck: Trachea normal, normal range of  motion and full passive range of motion without pain. Neck supple. No JVD present. No spinous process tenderness and no muscular tenderness present. No tracheal deviation present. No thyroid mass and no thyromegaly present.  Cardiovascular: Normal rate, regular rhythm, normal heart sounds, intact distal pulses and normal pulses. Exam reveals no gallop and no friction rub.  No murmur heard. Pulmonary/Chest: Effort normal and breath sounds normal. No respiratory distress. Right breast exhibits no inverted nipple, no mass, no nipple discharge, no skin change and no tenderness. Left breast exhibits  no inverted nipple, no mass, no nipple discharge, no skin change and no tenderness. Breasts are symmetrical.  Abdominal: Soft. Bowel sounds are normal. She exhibits no distension. There is no tenderness.  Musculoskeletal: Normal range of motion. She exhibits no edema or tenderness.       Cervical back: Normal.       Thoracic back: Normal.       Lumbar back: Normal.  Lymphadenopathy:       Head (right side): No tonsillar, no preauricular, no posterior auricular and no occipital adenopathy present.       Head (left side): No tonsillar, no preauricular, no posterior auricular and no occipital adenopathy present.    She has no cervical adenopathy.       Right: No supraclavicular adenopathy present.       Left: No supraclavicular adenopathy present.  Neurological: She is alert and oriented to person, place, and time. She has normal strength and normal reflexes. No cranial nerve deficit. She exhibits normal muscle tone. Coordination and gait normal.  Skin: Skin is warm, dry and intact. No rash noted. She is not diaphoretic. No cyanosis or erythema. Nails show no clubbing.  Psychiatric: She has a normal mood and affect. Her speech is normal and behavior is normal. Judgment and thought content normal.  Nursing note and vitals reviewed.  Results for orders placed or performed in visit on 12/27/17  COMPLETE  METABOLIC PANEL WITH GFR  Result Value Ref Range   Glucose, Bld 97 65 - 99 mg/dL   BUN 12 7 - 25 mg/dL   Creat 3.08 6.57 - 8.46 mg/dL   GFR, Est Non African American 82 > OR = 60 mL/min/1.76m2   GFR, Est African American 95 > OR = 60 mL/min/1.68m2   BUN/Creatinine Ratio NOT APPLICABLE 6 - 22 (calc)   Sodium 140 135 - 146 mmol/L   Potassium 4.7 3.5 - 5.3 mmol/L   Chloride 104 98 - 110 mmol/L   CO2 30 20 - 32 mmol/L   Calcium 9.5 8.6 - 10.4 mg/dL   Total Protein 6.7 6.1 - 8.1 g/dL   Albumin 4.6 3.6 - 5.1 g/dL   Globulin 2.1 1.9 - 3.7 g/dL (calc)   AG Ratio 2.2 1.0 - 2.5 (calc)   Total Bilirubin 0.6 0.2 - 1.2 mg/dL   Alkaline phosphatase (APISO) 57 33 - 130 U/L   AST 27 10 - 35 U/L   ALT 33 (H) 6 - 29 U/L  Lipid panel  Result Value Ref Range   Cholesterol 166 <200 mg/dL   HDL 33 (L) >96 mg/dL   Triglycerides 295 (H) <150 mg/dL   LDL Cholesterol (Calc) 95 mg/dL (calc)   Total CHOL/HDL Ratio 5.0 (H) <5.0 (calc)   Non-HDL Cholesterol (Calc) 133 (H) <130 mg/dL (calc)  TSH  Result Value Ref Range   TSH 3.36 0.40 - 4.50 mIU/L  CBC with Differential/Platelet  Result Value Ref Range   WBC 6.6 3.8 - 10.8 Thousand/uL   RBC 4.53 3.80 - 5.10 Million/uL   Hemoglobin 14.9 11.7 - 15.5 g/dL   HCT 28.4 13.2 - 44.0 %   MCV 97.8 80.0 - 100.0 fL   MCH 32.9 27.0 - 33.0 pg   MCHC 33.6 32.0 - 36.0 g/dL   RDW 10.2 72.5 - 36.6 %   Platelets 136 (L) 140 - 400 Thousand/uL   MPV 11.8 7.5 - 12.5 fL   Neutro Abs 3,307 1,500 - 7,800 cells/uL   Lymphs Abs 2,482 850 - 3,900 cells/uL  WBC mixed population 561 200 - 950 cells/uL   Eosinophils Absolute 231 15 - 500 cells/uL   Basophils Absolute 20 0 - 200 cells/uL   Neutrophils Relative % 50.1 %   Total Lymphocyte 37.6 %   Monocytes Relative 8.5 %   Eosinophils Relative 3.5 %   Basophils Relative 0.3 %      Assessment & Plan:   Problem List Items Addressed This Visit      Other   Thrombocytopenia (HCC)    Other Visit Diagnoses    Annual  physical exam    -  Primary   Relevant Orders   COMPLETE METABOLIC PANEL WITH GFR (Completed)   Screening for deficiency anemia       Encounter for screening examination for impaired glucose regulation and diabetes mellitus       Relevant Orders   COMPLETE METABOLIC PANEL WITH GFR (Completed)   Encounter for lipid screening for cardiovascular disease       Relevant Orders   Lipid panel   Breast cancer screening       Relevant Orders   MM DIGITAL SCREENING BILATERAL   Postmenopausal estrogen deficiency       Relevant Orders   DG Bone Density   Need for vaccination against Streptococcus pneumoniae using pneumococcal conjugate vaccine 13       Relevant Orders   Pneumococcal conjugate vaccine 13-valent (Completed)      Physical exam with no new abnormal findings.  Well adult with no acute concerns.   Plan: 1. Obtain health maintenance screenings as above according to age. - Increase physical activity to 30 minutes most days of the week.  - Eat healthy diet high in vegetables and fruits; low in refined carbohydrates. 2. Return 1 year for annual physical.   Follow up plan: Return in about 1 year (around 12/28/2018) for annual physical.  Wilhelmina McardleLauren Kolsen Choe, DNP, AGPCNP-BC Adult Gerontology Primary Care Nurse Practitioner [facility] 02/10/2018, 3:17 PM

## 2017-12-27 NOTE — Telephone Encounter (Signed)
The pt was scheduled for a nurse visit to get her shingle vaccine.

## 2017-12-27 NOTE — Telephone Encounter (Signed)
Pt called her insurance and they will pay all but $10 for shingles shot and tetanus shot.  Her call back number is [phone]

## 2017-12-27 NOTE — Patient Instructions (Addendum)
Kristine Le,   Thank you for coming in to clinic today.  1. Your mammogram and bone density orders have been placed.  Call the Scheduling phone number at [phone] to schedule your mammogram at your convenience.  You can choose to go to either location listed below.  Let the scheduler know which location you prefer.  [facility]  [facility]  [address]  [place], [place] [id number]   [facility] [place] [facility] [address] [place], [place] [address]   2. Labs today and results will be called to you once available.  3. Increase your physical activity until you are increasing your heart rate for 30 minutes on most days of the week.  - Increase your intake of healthy fruits and vegetables.  4. Perform Kegel exercises to strengthen your pelvic floor.   Please schedule a follow-up appointment with Kristine Le, AGNP. Return in about 1 year (around 12/28/2018) for annual physical.  If you have any other questions or concerns, please feel free to call the clinic or send a message through MyChart. You may also schedule an earlier appointment if necessary.  You will receive a survey after today's visit either digitally by e-mail or paper by [employer] mail. Your experiences and feedback matter to us.  Please respond so we know how we are doing as we provide care for you.   Kristine McardleLauren Deiondre Harrower, DNP, AGNP-BC Adult Gerontology Nurse Practitioner [facility], [facility]   Kegel Exercises Kegel exercises help strengthen the muscles that support the rectum, vagina, small intestine, bladder, and uterus. Doing Kegel exercises can help:  Improve bladder and bowel control.  Improve sexual response.  Reduce problems and discomfort during pregnancy.  Kegel exercises involve squeezing your pelvic floor muscles, which are the same muscles you squeeze when you try to stop the flow of urine. The exercises can be done  while sitting, standing, or lying down, but it is best to vary your position. Phase 1 exercises 1. Squeeze your pelvic floor muscles tight. You should feel a tight lift in your rectal area. If you are a female, you should also feel a tightness in your vaginal area. Keep your stomach, buttocks, and legs relaxed. 2. Hold the muscles tight for up to 10 seconds. 3. Relax your muscles. Repeat this exercise 50 times a day or as many times as told by your health care provider. Continue to do this exercise for at least 4-6 weeks or for as long as told by your health care provider. This information is not intended to replace advice given to you by your health care provider. Make sure you discuss any questions you have with your health care provider. Document Released: 08/22/2012 Document Revised: 04/30/2016 Document Reviewed: 07/26/2015 Elsevier Interactive Patient Education  [employer]2018 Elsevier Inc.

## 2017-12-28 ENCOUNTER — Telehealth: Payer: Self-pay

## 2017-12-28 ENCOUNTER — Encounter: Payer: Self-pay | Admitting: Nurse Practitioner

## 2017-12-28 NOTE — Telephone Encounter (Signed)
-----   Message from Galen ManilaLauren Renee Kennedy, NP sent at 12/28/2017  8:54 AM EDT ----- CBC is normal except slightly low platelet count.  You do not have anemia.  Low platelets could cause prolonged bleeding or easy bruising.  They are only slightly low, but should be measured at least once per year.  Will followup at your next physical.  TSH: thyroid function normal.   LIPID PANEL: - Total cholesterol is normal. - Triglycerides are significantly high.  These are directly related to diet (sugars and saturated fats). - Your LDL is normal.  LDL is your bad cholesterol that can build up in your arteries and veins and lead to heart attack and stroke over time.  This can be managed with medication and diet changes.   - Your HDL is a little low.  HDL is your good cholesterol.  When it is higher than 59, it can be protective against and prevent cholesterol buildup in your arteries that could lead to heart attack or stroke.   - You can lower your LDL and raise your HDL by exercising, by taking fish oil or by increasing your dietary omega-3 fatty acids.  Omega-3s are found in olive oil, fish, and seeds and nuts like almonds, walnuts, and pecans.  Avoid saturated fats found in meat and dairy products.  Polyunsaturated fats and monounsaturated fats are better choices, but still limit your overall intake of these.  Begin eating more whole grain carbohydrates with more dietary fiber instead of "white" carbohydrates like potatoes, rice, and white bread.  START taking fish oil 1,000 mg daily.  CMP: Your CMP is normal.  Normal fasting glucose and normal electrolytes, kidney function, and liver function.

## 2017-12-28 NOTE — Telephone Encounter (Signed)
The pt was notified about her lab results.

## 2017-12-28 NOTE — Telephone Encounter (Signed)
-----   Message from Lauren Renee Kennedy, NP sent at 12/28/2017  8:54 AM EDT ----- CBC is normal except slightly low platelet count.  You do not have anemia.  Low platelets could cause prolonged bleeding or easy bruising.  They are only slightly low, but should be measured at least once per year.  Will followup at your next physical.  TSH: thyroid function normal.   LIPID PANEL: - Total cholesterol is normal. - Triglycerides are significantly high.  These are directly related to diet (sugars and saturated fats). - Your LDL is normal.  LDL is your bad cholesterol that can build up in your arteries and veins and lead to heart attack and stroke over time.  This can be managed with medication and diet changes.   - Your HDL is a little low.  HDL is your good cholesterol.  When it is higher than 59, it can be protective against and prevent cholesterol buildup in your arteries that could lead to heart attack or stroke.   - You can lower your LDL and raise your HDL by exercising, by taking fish oil or by increasing your dietary omega-3 fatty acids.  Omega-3s are found in olive oil, fish, and seeds and nuts like almonds, walnuts, and pecans.  Avoid saturated fats found in meat and dairy products.  Polyunsaturated fats and monounsaturated fats are better choices, but still limit your overall intake of these.  Begin eating more whole grain carbohydrates with more dietary fiber instead of "white" carbohydrates like potatoes, rice, and white bread.  START taking fish oil 1,000 mg daily.  CMP: Your CMP is normal.  Normal fasting glucose and normal electrolytes, kidney function, and liver function.       

## 2018-01-03 ENCOUNTER — Ambulatory Visit: Payer: Self-pay

## 2018-01-05 ENCOUNTER — Ambulatory Visit (INDEPENDENT_AMBULATORY_CARE_PROVIDER_SITE_OTHER): Payer: Medicare Other

## 2018-01-05 DIAGNOSIS — Z23 Encounter for immunization: Secondary | ICD-10-CM

## 2018-02-10 ENCOUNTER — Encounter: Payer: Self-pay | Admitting: Nurse Practitioner

## 2018-07-06 ENCOUNTER — Ambulatory Visit (INDEPENDENT_AMBULATORY_CARE_PROVIDER_SITE_OTHER): Payer: Medicare Other

## 2018-07-06 DIAGNOSIS — Z23 Encounter for immunization: Secondary | ICD-10-CM

## 2018-10-09 ENCOUNTER — Encounter: Payer: Self-pay | Admitting: *Deleted

## 2018-10-09 ENCOUNTER — Telehealth: Payer: Self-pay | Admitting: *Deleted

## 2018-10-09 DIAGNOSIS — Z122 Encounter for screening for malignant neoplasm of respiratory organs: Secondary | ICD-10-CM

## 2018-10-09 NOTE — Telephone Encounter (Signed)
Patient has been notified that the annual lung cancer screening low dose CT scan is due currently or will be in the near future.  Confirmed that the patient is within the age range of 34-80, and asymptomatic, and currently exhibits no signs or symptoms of lung cancer.  Patient denies illness that would prevent curative treatment for lung cancer if found.  Verified smoking history, current smoker 1ppd with 53 pkyr history.  The shared decision making visit was completed on 10-18-17.  Patient is agreeable for the CT scan to be scheduled.  Will call patient back with date and time of appointment.

## 2018-10-12 ENCOUNTER — Telehealth: Payer: Self-pay | Admitting: *Deleted

## 2018-10-12 NOTE — Telephone Encounter (Signed)
Called pt to inform her of her appt for ldct screening on Friday 10/19/2018 here @ [facility] @ 10:00am, appt mailed to patient.

## 2018-10-16 ENCOUNTER — Ambulatory Visit: Payer: Medicare Other

## 2018-10-19 ENCOUNTER — Encounter: Payer: Self-pay | Admitting: *Deleted

## 2018-10-19 ENCOUNTER — Ambulatory Visit
Admission: RE | Admit: 2018-10-19 | Discharge: 2018-10-19 | Disposition: A | Discharge status: Home / Self Care | Payer: Medicare Other | Source: Ambulatory Visit | Attending: Nurse Practitioner | Admitting: Nurse Practitioner

## 2018-10-19 DIAGNOSIS — Z122 Encounter for screening for malignant neoplasm of respiratory organs: Secondary | ICD-10-CM | POA: Diagnosis not present

## 2018-12-18 ENCOUNTER — Ambulatory Visit: Payer: Medicare Other

## 2018-12-28 ENCOUNTER — Encounter: Payer: Medicare Other | Admitting: Nurse Practitioner

## 2019-01-22 ENCOUNTER — Ambulatory Visit (INDEPENDENT_AMBULATORY_CARE_PROVIDER_SITE_OTHER): Payer: Medicare Other

## 2019-01-22 ENCOUNTER — Other Ambulatory Visit: Payer: Self-pay

## 2019-01-22 DIAGNOSIS — Z0000 Encounter for general adult medical examination without abnormal findings: Secondary | ICD-10-CM | POA: Diagnosis not present

## 2019-01-22 NOTE — Patient Instructions (Signed)
Kristine Le , Thank you for taking time to come for your Medicare Wellness Visit. I appreciate your ongoing commitment to your health goals. Please review the following plan we discussed and let me know if I can assist you in the future.   Screening recommendations/referrals: Colonoscopy: no longer required Mammogram: no longer required Bone Density: no longer required Recommended yearly ophthalmology/optometry visit for glaucoma screening and checkup Recommended yearly dental visit for hygiene and checkup  Vaccinations: Influenza vaccine: up to date Pneumococcal vaccine: up to date Tdap vaccine: due, check with your insurance company for coverage  Shingles vaccine: shingrix completed     Advanced directives: please pick up a copy of the packet at the office if you would like to fill this out.   Conditions/risks identified: smoking cessation discussed,   Next appointment: Follow up in one year for your annual wellness exam.    Preventive Care 65 Years and Older, Female Preventive care refers to lifestyle choices and visits with your health care provider that can promote health and wellness. What does preventive care include?  A yearly physical exam. This is also called an annual well check.  Dental exams once or twice a year.  Routine eye exams. Ask your health care provider how often you should have your eyes checked.  Personal lifestyle choices, including:  Daily care of your teeth and gums.  Regular physical activity.  Eating a healthy diet.  Avoiding tobacco and drug use.  Limiting alcohol use.  Practicing safe sex.  Taking low-dose aspirin every day.  Taking vitamin and mineral supplements as recommended by your health care provider. What happens during an annual well check? The services and screenings done by your health care provider during your annual well check will depend on your age, overall health, lifestyle risk factors, and family history of disease.  Counseling  Your health care provider may ask you questions about your:  Alcohol use.  Tobacco use.  Drug use.  Emotional well-being.  Home and relationship well-being.  Sexual activity.  Eating habits.  History of falls.  Memory and ability to understand (cognition).  Work and work Astronomer.  Reproductive health. Screening  You may have the following tests or measurements:  Height, weight, and BMI.  Blood pressure.  Lipid and cholesterol levels. These may be checked every 5 years, or more frequently if you are over 66 years old.  Skin check.  Lung cancer screening. You may have this screening every year starting at age 90 if you have a 30-pack-year history of smoking and currently smoke or have quit within the past 15 years.  Fecal occult blood test (FOBT) of the stool. You may have this test every year starting at age 68.  Flexible sigmoidoscopy or colonoscopy. You may have a sigmoidoscopy every 5 years or a colonoscopy every 10 years starting at age 12.  Hepatitis C blood test.  Hepatitis B blood test.  Sexually transmitted disease (STD) testing.  Diabetes screening. This is done by checking your blood sugar (glucose) after you have not eaten for a while (fasting). You may have this done every 1-3 years.  Bone density scan. This is done to screen for osteoporosis. You may have this done starting at age 38.  Mammogram. This may be done every 1-2 years. Talk to your health care provider about how often you should have regular mammograms. Talk with your health care provider about your test results, treatment options, and if necessary, the need for more tests. Vaccines  Your  health care provider may recommend certain vaccines, such as:  Influenza vaccine. This is recommended every year.  Tetanus, diphtheria, and acellular pertussis (Tdap, Td) vaccine. You may need a Td booster every 10 years.  Zoster vaccine. You may need this after age 45.   Pneumococcal 13-valent conjugate (PCV13) vaccine. One dose is recommended after age 42.  Pneumococcal polysaccharide (PPSV23) vaccine. One dose is recommended after age 89. Talk to your health care provider about which screenings and vaccines you need and how often you need them. This information is not intended to replace advice given to you by your health care provider. Make sure you discuss any questions you have with your health care provider. Document Released: 10/02/2015 Document Revised: 05/25/2016 Document Reviewed: 07/07/2015 Elsevier Interactive Patient Education  2017 [place] Prevention in the Home Falls can cause injuries. They can happen to people of all ages. There are many things you can do to make your home safe and to help prevent falls. What can I do on the outside of my home?  Regularly fix the edges of walkways and driveways and fix any cracks.  Remove anything that might make you trip as you walk through a door, such as a raised step or threshold.  Trim any bushes or trees on the path to your home.  Use bright outdoor lighting.  Clear any walking paths of anything that might make someone trip, such as rocks or tools.  Regularly check to see if handrails are loose or broken. Make sure that both sides of any steps have handrails.  Any raised decks and porches should have guardrails on the edges.  Have any leaves, snow, or ice cleared regularly.  Use sand or salt on walking paths during winter.  Clean up any spills in your garage right away. This includes oil or grease spills. What can I do in the bathroom?  Use night lights.  Install grab bars by the toilet and in the tub and shower. Do not use towel bars as grab bars.  Use non-skid mats or decals in the tub or shower.  If you need to sit down in the shower, use a plastic, non-slip stool.  Keep the floor dry. Clean up any water that spills on the floor as soon as it happens.  Remove soap  buildup in the tub or shower regularly.  Attach bath mats securely with double-sided non-slip rug tape.  Do not have throw rugs and other things on the floor that can make you trip. What can I do in the bedroom?  Use night lights.  Make sure that you have a light by your bed that is easy to reach.  Do not use any sheets or blankets that are too big for your bed. They should not hang down onto the floor.  Have a firm chair that has side arms. You can use this for support while you get dressed.  Do not have throw rugs and other things on the floor that can make you trip. What can I do in the kitchen?  Clean up any spills right away.  Avoid walking on wet floors.  Keep items that you use a lot in easy-to-reach places.  If you need to reach something above you, use a strong step stool that has a grab bar.  Keep electrical cords out of the way.  Do not use floor polish or wax that makes floors slippery. If you must use wax, use non-skid floor wax.  Do not  have throw rugs and other things on the floor that can make you trip. What can I do with my stairs?  Do not leave any items on the stairs.  Make sure that there are handrails on both sides of the stairs and use them. Fix handrails that are broken or loose. Make sure that handrails are as long as the stairways.  Check any carpeting to make sure that it is firmly attached to the stairs. Fix any carpet that is loose or worn.  Avoid having throw rugs at the top or bottom of the stairs. If you do have throw rugs, attach them to the floor with carpet tape.  Make sure that you have a light switch at the top of the stairs and the bottom of the stairs. If you do not have them, ask someone to add them for you. What else can I do to help prevent falls?  Wear shoes that:  Do not have high heels.  Have rubber bottoms.  Are comfortable and fit you well.  Are closed at the toe. Do not wear sandals.  If you use a stepladder:  Make  sure that it is fully opened. Do not climb a closed stepladder.  Make sure that both sides of the stepladder are locked into place.  Ask someone to hold it for you, if possible.  Clearly mark and make sure that you can see:  Any grab bars or handrails.  First and last steps.  Where the edge of each step is.  Use tools that help you move around (mobility aids) if they are needed. These include:  Canes.  Walkers.  Scooters.  Crutches.  Turn on the lights when you go into a dark area. Replace any light bulbs as soon as they burn out.  Set up your furniture so you have a clear path. Avoid moving your furniture around.  If any of your floors are uneven, fix them.  If there are any pets around you, be aware of where they are.  Review your medicines with your doctor. Some medicines can make you feel dizzy. This can increase your chance of falling. Ask your doctor what other things that you can do to help prevent falls. This information is not intended to replace advice given to you by your health care provider. Make sure you discuss any questions you have with your health care provider. Document Released: 07/02/2009 Document Revised: 02/11/2016 Document Reviewed: 10/10/2014 Elsevier Interactive Patient Education  2017 [employer].

## 2019-01-22 NOTE — Progress Notes (Signed)
Subjective:   Kristine Le is a 78 y.o. female who presents for Medicare Annual (Subsequent) preventive examination.  This visit is being conducted via phone call  - after an attmept to do on video chat - due to the COVID-19 pandemic. This patient has given me verbal consent via phone to conduct this visit, patient states they are participating from their home address. Some vital signs may be absent or patient reported.   Patient identification: identified by name, DOB, and current address.    Review of Systems:   Cardiac Risk Factors include: advanced age (>24men, >48 women)     Objective:     Vitals: There were no vitals taken for this visit.  There is no height or weight on file to calculate BMI.  Advanced Directives 01/22/2019 10/10/2017  Does Patient Have a Medical Advance Directive? No No  Would patient like information on creating a medical advance directive? Yes (MAU/Ambulatory/Procedural Areas - Information given) Yes (MAU/Ambulatory/Procedural Areas - Information given)    Tobacco Social History   Tobacco Use  Smoking Status Current Every Day Smoker  . Packs/day: 1.00  . Years: 53.00  . Pack years: 53.00  . Types: Cigarettes  Smokeless Tobacco Former Neurosurgeon  Tobacco Comment   she quit for 8 years in 1980s and would like to quit.  Meds not effective in past.     Ready to quit: Yes Counseling given: Yes Comment: she quit for 8 years in 1980s and would like to quit.  Meds not effective in past.   Clinical Intake:  Pre-visit preparation completed: Yes  Pain : No/denies pain     Nutritional Risks: None Diabetes: No  How often do you need to have someone help you when you read instructions, pamphlets, or other written materials from your doctor or pharmacy?: 1 - Never What is the last grade level you completed in school?: high school   Interpreter Needed?: No  Information entered by :: Elia Nunley,LPN   Past Medical History:  Diagnosis Date  . Allergy    . Colon polyps    Past Surgical History:  Procedure Laterality Date  . KNEE SURGERY    . PARTIAL HYSTERECTOMY    . THUMB ARTHROSCOPY     Family History  Problem Relation Age of Onset  . Breast cancer Cousin        maternal  . Kidney cancer Father   . Asthma Mother   . Arthritis Sister   . GER disease Sister   . Diabetes Sister   . Bone cancer Son   . Kidney cancer Son    Social History   Socioeconomic History  . Marital status: Widowed    Spouse name: Not on file  . Number of children: Not on file  . Years of education: Not on file  . Highest education level: High school graduate  Occupational History  . Occupation: retired  Engineer, production  . Financial resource strain: Not very hard  . Food insecurity:    Worry: Never true    Inability: Never true  . Transportation needs:    Medical: No    Non-medical: No  Tobacco Use  . Smoking status: Current Every Day Smoker    Packs/day: 1.00    Years: 53.00    Pack years: 53.00    Types: Cigarettes  . Smokeless tobacco: Former Neurosurgeon  . Tobacco comment: she quit for 8 years in 1980s and would like to quit.  Meds not effective in past.  Substance  and Sexual Activity  . Alcohol use: Yes    Alcohol/week: 6.0 standard drinks    Types: 6 Cans of beer per week    Comment: has liquor occasioanlly   . Drug use: No  . Sexual activity: Not on file  Lifestyle  . Physical activity:    Days per week: 0 days    Minutes per session: 0 min  . Stress: Not at all  Relationships  . Social connections:    Talks on phone: More than three times a week    Gets together: Three times a week    Attends religious service: More than 4 times per year    Active member of club or organization: No    Attends meetings of clubs or organizations: Never    Relationship status: Widowed  Other Topics Concern  . Not on file  Social History Narrative  . Not on file    Outpatient Encounter Medications as of 01/22/2019  Medication Sig  . B  Complex-C-E-Zn (B COMPLEX-C-E-ZINC) tablet Take 1 tablet by mouth daily.  . Biotin 10 MG CAPS Take by mouth.  . cetirizine (ZYRTEC) 10 MG tablet Take by mouth.  . Cholecalciferol (VITAMIN D3) 1000 units CAPS Take by mouth.  . Collagen 500 MG CAPS Take by mouth.  . Omega-3 Fatty Acids (FISH OIL) 1200 MG CAPS Take by mouth.  Marland Kitchen ibuprofen (ADVIL,MOTRIN) 200 MG tablet Take by mouth.  . [DISCONTINUED] Misc Natural Products (TURMERIC CURCUMIN) CAPS Take by mouth.   No facility-administered encounter medications on file as of 01/22/2019.     Activities of Daily Living In your present state of health, do you have any difficulty performing the following activities: 01/22/2019  Hearing? N  Vision? Y  Comment difficulty with left eye   Difficulty concentrating or making decisions? N  Walking or climbing stairs? N  Dressing or bathing? N  Doing errands, shopping? N  Preparing Food and eating ? N  Using the Toilet? N  In the past six months, have you accidently leaked urine? N  Do you have problems with loss of bowel control? N  Managing your Medications? N  Managing your Finances? N  Housekeeping or managing your Housekeeping? N  Some recent data might be hidden    Patient Care Team: Galen Manila, NP as PCP - General (Nurse Practitioner)    Assessment:   This is a routine wellness examination for [name].  Exercise Activities and Dietary recommendations Current Exercise Habits: The patient does not participate in regular exercise at present, Exercise limited by: None identified  Goals    . Exercise 3x per week (30 min per time)     Recommend walking 10-15 min 3x a week     . Quit Smoking     Smoking cessation discussed       Fall Risk: Fall Risk  01/22/2019 10/10/2017  Falls in the past year? 0 No    FALL RISK PREVENTION PERTAINING TO THE HOME:  Any stairs in or around the home? Yes  If so, are there any without handrails? No   Home free of loose throw rugs in walkways,  pet beds, electrical cords, etc? Yes  Adequate lighting in your home to reduce risk of falls? Yes   ASSISTIVE DEVICES UTILIZED TO PREVENT FALLS:  Life alert? No  Use of a cane, walker or w/c? No  Grab bars in the bathroom? Yes  Shower chair or bench in shower? No  Elevated toilet seat or a handicapped  toilet? No   DME ORDERS:  DME order needed?  No   TIMED UP AND GO:  Unable to perform  Depression Screen PHQ 2/9 Scores 01/22/2019 10/10/2017 09/26/2017  PHQ - 2 Score 0 0 0  PHQ- 9 Score - - 0     Cognitive Function     6CIT Screen 01/22/2019  What Year? 0 points  What month? 0 points  What time? 0 points  Count back from 20 0 points  Months in reverse 0 points  Repeat phrase 0 points  Total Score 0    Immunization History  Administered Date(s) Administered  . Influenza Split 06/24/2014  . Influenza,inj,Quad PF,6+ Mos 07/06/2018  . Influenza-Unspecified 06/07/2012, 06/24/2015, 06/27/2017  . Pneumococcal Conjugate-13 12/27/2017  . Pneumococcal Polysaccharide-23 06/24/2014  . Zoster Recombinat (Shingrix) 01/05/2018, 07/06/2018    Qualifies for Shingles Vaccine? Yes  shingrix completed   Tdap: due  Education has been provided regarding the importance of this vaccine. Advised may receive this vaccine at local pharmacy or Health Dept. Aware to provide a copy of the vaccination record if obtained from local pharmacy or Health Dept. Verbalized acceptance and understanding.  Flu Vaccine: up to date   Pneumococcal Vaccine: up to date  Screening Tests Health Maintenance  Topic Date Due  . TETANUS/TDAP  01/22/2020 (Originally 10/08/1959)  . INFLUENZA VACCINE  04/20/2019  . PNA vac Low Risk Adult  Completed  . DEXA SCAN  Discontinued    Cancer Screenings:  Colorectal Screening:  No longer required.   Mammogram:no longer required- declines any more screenings   Bone Density: declined  Lung Cancer Screening: (Low Dose CT Chest recommended if Age 55-80 years, 30  pack-year currently smoking OR have quit w/in 15years.) does qualify.  Last completed 10/19/2018  Additional Screening:  Hepatitis C Screening: does not qualify  Vision Screening: Recommended annual ophthalmology exams for early detection of glaucoma and other disorders of the eye. Is the patient up to date with their annual eye exam?  Yes  Who is the provider or what is the name of the office in which the pt attends annual eye exams? Dr.Shade   Dental Screening: Recommended annual dental exams for proper oral hygiene  Community Resource Referral:  CRR required this visit?  No       Plan:  I have personally reviewed and addressed the Medicare Annual Wellness questionnaire and have noted the following in the patient's chart:  A. Medical and social history B. Use of alcohol, tobacco or illicit drugs  C. Current medications and supplements D. Functional ability and status E.  Nutritional status F.  Physical activity G. Advance directives H. List of other physicians I.  Hospitalizations, surgeries, and ER visits in previous 12 months J.  Vitals K. Screenings such as hearing and vision if needed, cognitive and depression L. Referrals and appointments   In addition, I have reviewed and discussed with patient certain preventive protocols, quality metrics, and best practice recommendations. A written personalized care plan for preventive services as well as general preventive health recommendations were provided to patient via mail.   Signed,    Collene SchlichterHill, Kamry Faraci A, LPN  1/6/10965/01/2019 Nurse Health Advisor   Nurse Notes: wants to discuss chantix at next visit

## 2019-02-18 ENCOUNTER — Encounter: Payer: Medicare Other | Admitting: Nurse Practitioner

## 2019-02-18 ENCOUNTER — Other Ambulatory Visit: Payer: Self-pay

## 2019-04-16 ENCOUNTER — Ambulatory Visit (INDEPENDENT_AMBULATORY_CARE_PROVIDER_SITE_OTHER): Payer: Medicare Other | Admitting: Nurse Practitioner

## 2019-04-16 ENCOUNTER — Other Ambulatory Visit: Payer: Self-pay

## 2019-04-16 ENCOUNTER — Encounter: Payer: Self-pay | Admitting: Nurse Practitioner

## 2019-04-16 VITALS — BP 120/58 | HR 72 | Resp 18 | Ht 67.5 in | Wt 169.4 lb

## 2019-04-16 DIAGNOSIS — Z716 Tobacco abuse counseling: Secondary | ICD-10-CM | POA: Diagnosis not present

## 2019-04-16 DIAGNOSIS — Z780 Asymptomatic menopausal state: Secondary | ICD-10-CM

## 2019-04-16 DIAGNOSIS — Z0000 Encounter for general adult medical examination without abnormal findings: Secondary | ICD-10-CM | POA: Diagnosis not present

## 2019-04-16 NOTE — Progress Notes (Signed)
Subjective:    Patient ID: Kristine Le, female    DOB: 1941/02/06, 78 y.o.   MRN: [id number]  Kristine Le is a 78 y.o. female presenting on 04/16/2019 for Annual Exam  HPI Annual Physical Exam Patient has been feeling well.  They have acute concerns today about moods. Sleeps 7-8 hours per night interrupted.  Feels more down with being home and not social.  She is getting down and depressed, bored.  HEALTH MAINTENANCE: Weight/BMI: healthy Physical activity: not regular  Diet: generally healthy (has had significant reduction of alcohol) Seatbelt: regularly Sunscreen: rarely Mammogram: up to date DEXA: due Optometry: regular Dentistry: regular  VACCINES: Tetanus: up to date Pneumonia: received  Hypertriglyceridemia   Wellbutrin never helped control cravings in past for patient.  Past Medical History:  Diagnosis Date  . Allergy   . Colon polyps    Past Surgical History:  Procedure Laterality Date  . KNEE SURGERY    . PARTIAL HYSTERECTOMY    . THUMB ARTHROSCOPY     Social History   Socioeconomic History  . Marital status: Widowed    Spouse name: Not on file  . Number of children: Not on file  . Years of education: Not on file  . Highest education level: High school graduate  Occupational History  . Occupation: retired  Scientific laboratory technician  . Financial resource strain: Not very hard  . Food insecurity    Worry: Never true    Inability: Never true  . Transportation needs    Medical: No    Non-medical: No  Tobacco Use  . Smoking status: Current Every Day Smoker    Packs/day: 1.00    Years: 53.00    Pack years: 53.00    Types: Cigarettes  . Smokeless tobacco: Former Systems developer  . Tobacco comment: she quit for 8 years in 1980s and would like to quit.  Meds not effective in past.  Substance and Sexual Activity  . Alcohol use: Yes    Alcohol/week: 2.0 standard drinks    Types: 2 Cans of beer per week    Frequency: Never    Comment: occasionally  . Drug use: No  .  Sexual activity: Not on file  Lifestyle  . Physical activity    Days per week: 0 days    Minutes per session: 0 min  . Stress: Not at all  Relationships  . Social connections    Talks on phone: More than three times a week    Gets together: Three times a week    Attends religious service: More than 4 times per year    Active member of club or organization: No    Attends meetings of clubs or organizations: Never    Relationship status: Widowed  . Intimate partner violence    Fear of current or ex partner: No    Emotionally abused: No    Physically abused: No    Forced sexual activity: No  Other Topics Concern  . Not on file  Social History Narrative  . Not on file   Family History  Problem Relation Age of Onset  . Breast cancer Cousin        maternal  . Kidney cancer Father   . Asthma Mother   . Arthritis Sister   . GER disease Sister   . Diabetes Sister   . Bone cancer Son   . Kidney cancer Son    Current Outpatient Medications on File Prior to Visit  Medication Sig  . B  Complex-C-E-Zn (B COMPLEX-C-E-ZINC) tablet Take 1 tablet by mouth daily.  . Cholecalciferol (VITAMIN D3) 1000 units CAPS Take by mouth.  . Collagen 500 MG CAPS Take by mouth.  . Omega-3 Fatty Acids (FISH OIL) 1200 MG CAPS Take by mouth.  . cetirizine (ZYRTEC) 10 MG tablet Take by mouth.  Marland Kitchen. ibuprofen (ADVIL,MOTRIN) 200 MG tablet Take by mouth.   No current facility-administered medications on file prior to visit.     Review of Systems Per HPI unless specifically indicated above     Objective:    BP (!) 120/58 (BP Location: Left Arm, Patient Position: Sitting, Cuff Size: Normal)   Pulse 72   Resp 18   Ht 5' 7.5" (1.715 m)   Wt 169 lb 6.4 oz (76.8 kg)   SpO2 98%   BMI 26.14 kg/m   Wt Readings from Last 3 Encounters:  04/16/19 169 lb 6.4 oz (76.8 kg)  10/19/18 176 lb (79.8 kg)  12/27/17 176 lb 6.4 oz (80 kg)    Physical Exam Vitals signs and nursing note reviewed.  Constitutional:       General: She is not in acute distress.    Appearance: She is well-developed.  HENT:     Head: Normocephalic and atraumatic.     Right Ear: Tympanic membrane, ear canal and external ear normal.     Left Ear: Tympanic membrane, ear canal and external ear normal.     Nose: Nose normal.  Eyes:     Conjunctiva/sclera: Conjunctivae normal.     Pupils: Pupils are equal, round, and reactive to light.  Neck:     Musculoskeletal: Normal range of motion and neck supple.     Thyroid: No thyromegaly.     Vascular: No JVD.     Trachea: No tracheal deviation.  Cardiovascular:     Rate and Rhythm: Normal rate and regular rhythm.     Heart sounds: Normal heart sounds. No murmur. No friction rub. No gallop.   Pulmonary:     Effort: Pulmonary effort is normal. Prolonged expiration present. No respiratory distress.     Breath sounds: Normal breath sounds. Decreased air movement present.  Abdominal:     General: Bowel sounds are normal. There is no distension.     Palpations: Abdomen is soft.     Tenderness: There is no abdominal tenderness.  Musculoskeletal: Normal range of motion.  Lymphadenopathy:     Cervical: No cervical adenopathy.  Skin:    General: Skin is warm and dry.  Neurological:     Mental Status: She is alert and oriented to person, place, and time.     Cranial Nerves: No cranial nerve deficit.  Psychiatric:        Behavior: Behavior normal.        Thought Content: Thought content normal.        Judgment: Judgment normal.      Results for orders placed or performed in visit on 12/27/17  COMPLETE METABOLIC PANEL WITH GFR  Result Value Ref Range   Glucose, Bld 97 65 - 99 mg/dL   BUN 12 7 - 25 mg/dL   Creat 1.610.72 0.960.60 - 0.450.93 mg/dL   GFR, Est Non African American 82 > OR = 60 mL/min/1.5373m2   GFR, Est African American 95 > OR = 60 mL/min/1.7173m2   BUN/Creatinine Ratio NOT APPLICABLE 6 - 22 (calc)   Sodium 140 135 - 146 mmol/L   Potassium 4.7 3.5 - 5.3 mmol/L   Chloride 104 98 -  110  mmol/L   CO2 30 20 - 32 mmol/L   Calcium 9.5 8.6 - 10.4 mg/dL   Total Protein 6.7 6.1 - 8.1 g/dL   Albumin 4.6 3.6 - 5.1 g/dL   Globulin 2.1 1.9 - 3.7 g/dL (calc)   AG Ratio 2.2 1.0 - 2.5 (calc)   Total Bilirubin 0.6 0.2 - 1.2 mg/dL   Alkaline phosphatase (APISO) 57 33 - 130 U/L   AST 27 10 - 35 U/L   ALT 33 (H) 6 - 29 U/L  Lipid panel  Result Value Ref Range   Cholesterol 166 <200 mg/dL   HDL 33 (L) >16>50 mg/dL   Triglycerides 109264 (H) <150 mg/dL   LDL Cholesterol (Calc) 95 mg/dL (calc)   Total CHOL/HDL Ratio 5.0 (H) <5.0 (calc)   Non-HDL Cholesterol (Calc) 133 (H) <130 mg/dL (calc)  TSH  Result Value Ref Range   TSH 3.36 0.40 - 4.50 mIU/L  CBC with Differential/Platelet  Result Value Ref Range   WBC 6.6 3.8 - 10.8 Thousand/uL   RBC 4.53 3.80 - 5.10 Million/uL   Hemoglobin 14.9 11.7 - 15.5 g/dL   HCT 60.444.3 54.035.0 - 98.145.0 %   MCV 97.8 80.0 - 100.0 fL   MCH 32.9 27.0 - 33.0 pg   MCHC 33.6 32.0 - 36.0 g/dL   RDW 19.112.4 47.811.0 - 29.515.0 %   Platelets 136 (L) 140 - 400 Thousand/uL   MPV 11.8 7.5 - 12.5 fL   Neutro Abs 3,307 1,500 - 7,800 cells/uL   Lymphs Abs 2,482 850 - 3,900 cells/uL   WBC mixed population 561 200 - 950 cells/uL   Eosinophils Absolute 231 15 - 500 cells/uL   Basophils Absolute 20 0 - 200 cells/uL   Neutrophils Relative % 50.1 %   Total Lymphocyte 37.6 %   Monocytes Relative 8.5 %   Eosinophils Relative 3.5 %   Basophils Relative 0.3 %      Assessment & Plan:   Problem List Items Addressed This Visit    None    Visit Diagnoses    Encounter for annual physical exam    -  Primary   Relevant Orders   DG Bone Density   Postmenopausal estrogen deficiency       Relevant Orders   DG Bone Density     Annual physical exam without new findings.  Well adult with no acute concerns.  Plan: 1. Obtain health maintenance screenings as above according to age. - Increase physical activity to 30 minutes most days of the week.  - Eat healthy diet high in vegetables  and fruits; low in refined carbohydrates. - Screening labs and tests as ordered.  Needs repeat Dexa for evaluating progress. 2. Return 1 year for annual physical.   Smoking cessation: Patient is ready to quit  Has > 30 pack years.    Plan: 1. 7 days prior to quit date, START Chantix starting pack.  Take 0.5 mg tablet once daily w/ food for 3 days.  Take 0.5 mg tablet twice daily w/ food for 3 days.  Then, take 1 mg tablet twice daily and continue for total treatment w/ Chantix of 4-12 weeks.  Stop smoking or reduce by half on quit date. - Mutually set quit date: 05/03/2019 - START date of Chantix: 04/26/2019 - Reviewed side effects of nausea, vivid dreams, depression, possible SI.    Follow up plan: Return in about 6 weeks (around 05/28/2019) for smoking cessation and 1 year for annual physical.  Wilhelmina McardleLauren Allayah Raineri, DNP, AGPCNP-BC  Adult Gerontology Primary Care Nurse Practitioner [facility] 04/16/2019, 9:23 AM

## 2019-04-16 NOTE — Patient Instructions (Addendum)
Kristine Le,   Thank you for coming in to clinic today.  1. For smoking cessation, we are going to start chantix.   Start varenicline (Chantix) 7 days BEFORE your quit date.    Your quit date: 05/03/2019      START Chantix on: 04/26/2019  - Day 1-3: Take one 0.5mg  tablet once daily WITH FOOD - Days 4-7: increase to one 0.5mg  tablet twice per day.  - Days 7- 12 weeks max: Increase to one 1mg  tablet twice per day for up to 12 weeks  To quit smoking:  - Only start this treatment if you are mentally ready to quit. - Start Chantix 1 week before your quit date.  If unable to quit completely in 7 days, then work to reduce by 50% or more by 4 weeks, then another 50% reduction in 4 more weeks, and lastly quit after a final 4 weeks.  - We can continue Chantix for up to 12 weeks total if needed for maintenance. - Common side effects include nausea (take with food), headaches, insomnia, vivid dreams, mood instability, seizure, and a rare risk of suicidal ideation.  If you have any serious agitation or acute depression with severe mood changes you need to stop taking the medicine immediately   Please schedule a follow-up appointment with Cassell Smiles, AGNP. Return in about 6 weeks (around 05/28/2019) for smoking cessation and 1 year for annual physical.  If you have any other questions or concerns, please feel free to call the clinic or send a message through [place]. You may also schedule an earlier appointment if necessary.  You will receive a survey after today's visit either digitally by e-mail or paper by [employer]. Your experiences and feedback matter to [place].  Please respond so we know how we are doing as we provide care for you.   Cassell Smiles, DNP, AGNP-BC Adult Gerontology Nurse Practitioner [place]

## 2019-05-28 ENCOUNTER — Encounter: Payer: Self-pay | Admitting: Nurse Practitioner

## 2019-05-28 ENCOUNTER — Ambulatory Visit (INDEPENDENT_AMBULATORY_CARE_PROVIDER_SITE_OTHER): Payer: Medicare Other | Admitting: Nurse Practitioner

## 2019-05-28 ENCOUNTER — Other Ambulatory Visit: Payer: Self-pay

## 2019-05-28 DIAGNOSIS — J410 Simple chronic bronchitis: Secondary | ICD-10-CM

## 2019-05-28 DIAGNOSIS — F17200 Nicotine dependence, unspecified, uncomplicated: Secondary | ICD-10-CM

## 2019-05-28 NOTE — Progress Notes (Signed)
Telemedicine Encounter: Disclosed to patient at start of encounter that we will provide appropriate telemedicine services.  Patient consents to be treated via phone prior to discussion. - Patient is at her home and is accessed via telephone. - Services are provided by Wilhelmina McardleLauren Fabrizzio Marcella from [facility].  Subjective:    Patient ID: Kristine Le, female    DOB: 06-May-1941, 78 y.o.   MRN: [id number]  Kristine Le is a 78 y.o. female presenting on 05/28/2019 for Nicotine Dependence  HPI Nicotine Dependence Patient did not start Chantix after last visit due to cost of medication.  Tier 2 or 3 was too expensive for patient.   Patient states she really doesn't want to/isn't ready to quit at this time.   - Wellbutrin was not effective in past to help curb cravings.  Simple chronic bronchitis Continues to have regular cough.  Denies any regular shortness of breath. - Only thing that bothers her with cough is bladder. Occasionally needs to change her clothes after coughing due to incontinence. - Patient has been on maintenance inhalers in past.  Not currently taking any inhalers.  Still has a Proair inhaler at home.  Does not need to use it.  Social History   Tobacco Use  . Smoking status: Current Every Day Smoker    Packs/day: 1.00    Years: 53.00    Pack years: 53.00    Types: Cigarettes  . Smokeless tobacco: Former NeurosurgeonUser  . Tobacco comment: she quit for 8 years in 1980s and would like to quit.  Meds not effective in past.  Substance Use Topics  . Alcohol use: Yes    Alcohol/week: 2.0 standard drinks    Types: 2 Cans of beer per week    Frequency: Never    Comment: occasionally  . Drug use: No    Review of Systems Per HPI unless specifically indicated above     Objective:    There were no vitals taken for this visit.  Wt Readings from Last 3 Encounters:  04/16/19 169 lb 6.4 oz (76.8 kg)  10/19/18 176 lb (79.8 kg)  12/27/17 176 lb 6.4 oz (80 kg)    Physical  Exam Patient remotely monitored.  Verbal communication appropriate.  Cognition normal.  Results for orders placed or performed in visit on 12/27/17  COMPLETE METABOLIC PANEL WITH GFR  Result Value Ref Range   Glucose, Bld 97 65 - 99 mg/dL   BUN 12 7 - 25 mg/dL   Creat 4.090.72 8.110.60 - 9.140.93 mg/dL   GFR, Est Non African American 82 > OR = 60 mL/min/1.7373m2   GFR, Est African American 95 > OR = 60 mL/min/1.1373m2   BUN/Creatinine Ratio NOT APPLICABLE 6 - 22 (calc)   Sodium 140 135 - 146 mmol/L   Potassium 4.7 3.5 - 5.3 mmol/L   Chloride 104 98 - 110 mmol/L   CO2 30 20 - 32 mmol/L   Calcium 9.5 8.6 - 10.4 mg/dL   Total Protein 6.7 6.1 - 8.1 g/dL   Albumin 4.6 3.6 - 5.1 g/dL   Globulin 2.1 1.9 - 3.7 g/dL (calc)   AG Ratio 2.2 1.0 - 2.5 (calc)   Total Bilirubin 0.6 0.2 - 1.2 mg/dL   Alkaline phosphatase (APISO) 57 33 - 130 U/L   AST 27 10 - 35 U/L   ALT 33 (H) 6 - 29 U/L  Lipid panel  Result Value Ref Range   Cholesterol 166 <200 mg/dL   HDL 33 (L) >78>50 mg/dL  Triglycerides 264 (H) <150 mg/dL   LDL Cholesterol (Calc) 95 mg/dL (calc)   Total CHOL/HDL Ratio 5.0 (H) <5.0 (calc)   Non-HDL Cholesterol (Calc) 133 (H) <130 mg/dL (calc)  TSH  Result Value Ref Range   TSH 3.36 0.40 - 4.50 mIU/L  CBC with Differential/Platelet  Result Value Ref Range   WBC 6.6 3.8 - 10.8 Thousand/uL   RBC 4.53 3.80 - 5.10 Million/uL   Hemoglobin 14.9 11.7 - 15.5 g/dL   HCT 44.3 35.0 - 45.0 %   MCV 97.8 80.0 - 100.0 fL   MCH 32.9 27.0 - 33.0 pg   MCHC 33.6 32.0 - 36.0 g/dL   RDW 12.4 11.0 - 15.0 %   Platelets 136 (L) 140 - 400 Thousand/uL   MPV 11.8 7.5 - 12.5 fL   Neutro Abs 3,307 1,500 - 7,800 cells/uL   Lymphs Abs 2,482 850 - 3,900 cells/uL   WBC mixed population 561 200 - 950 cells/uL   Eosinophils Absolute 231 15 - 500 cells/uL   Basophils Absolute 20 0 - 200 cells/uL   Neutrophils Relative % 50.1 %   Total Lymphocyte 37.6 %   Monocytes Relative 8.5 %   Eosinophils Relative 3.5 %   Basophils  Relative 0.3 %      Assessment & Plan:   Problem List Items Addressed This Visit      Respiratory   Simple chronic bronchitis (HCC) - Primary COPD likely mild.  No significant impact to daily life for exercise tolerance.  QOL is impacted by increased urinary incontinence.  Plan: 1. Advair 100-50 mg one puff twice daily is recommended to patient today.  Patient declines medication.   Offered to send between visits if needed.  Patient to call clinic 2. Continue to encourage smoking cessation (see below) 3. Follow-up 3 months.     Other   Tobacco dependence Active tobacco user - 1 ppd.  Not currently ready to quit.  Previously ready, but had difficulty with obtaining affordable medication.    Plan: 1. Encouraged patient to continue consider quitting.  Not likely to be successful to quit with Chantix if not ready to quit. 2. No medications prescribed at this time. 3. Encourage cutting back number of cigs smoked per day 4. Follow-up prn.      - Time spent in direct consultation with patient via telemedicine about above concerns: 9 minutes  Follow up plan: FOLLOW-UP 3 months for chronic bronchitis.  Cassell Smiles, DNP, AGPCNP-BC Adult Gerontology Primary Care Nurse Practitioner [facility] 05/28/2019, 8:57 AM

## 2019-09-30 ENCOUNTER — Other Ambulatory Visit: Payer: Self-pay

## 2019-09-30 ENCOUNTER — Ambulatory Visit (INDEPENDENT_AMBULATORY_CARE_PROVIDER_SITE_OTHER): Payer: Medicare PPO | Admitting: Family Medicine

## 2019-09-30 ENCOUNTER — Encounter: Payer: Self-pay | Admitting: Family Medicine

## 2019-09-30 DIAGNOSIS — J0110 Acute frontal sinusitis, unspecified: Secondary | ICD-10-CM | POA: Diagnosis not present

## 2019-09-30 MED ORDER — AMOXICILLIN-POT CLAVULANATE 875-125 MG PO TABS: 1.0000 | ORAL_TABLET | Freq: Two times a day (BID) | ORAL | 0 refills | Status: DC

## 2019-09-30 MED ORDER — IPRATROPIUM BROMIDE 0.06 % NA SOLN: 2.0000 | Freq: Four times a day (QID) | NASAL | 0 refills | Status: DC

## 2019-09-30 NOTE — Progress Notes (Signed)
Virtual Visit via Telephone The purpose of this virtual visit is to provide medical care while limiting exposure to the novel coronavirus (COVID19) for both patient and office staff.  Consent was obtained for phone visit:  Yes.   Answered questions that patient had about telehealth interaction:  Yes.   I discussed the limitations, risks, security and privacy concerns of performing an evaluation and management service by telephone. I also discussed with the patient that there may be a patient responsible charge related to this service. The patient expressed understanding and agreed to proceed.  Patient Location: Home Provider Location: [facility])   ---------------------------------------------------------------------- Chief Complaint  Patient presents with  . Sinusitis    onset last wednesday denies HA and fever    S: Reviewed CMA documentation. I have called patient and gathered additional HPI as follows:  Sinusitis / Headache Reports that symptoms started last week on Weds 09/24/18 with onset symptoms, with headache, nasal congestion, post nasal drainage, headache. Usually has frequent sinusitis episodes for long time. - Tried OTC Mucinex and Mucinex DM, Cetirizine - meds usually work, now this one is not improving  Denies any high risk travel to areas of current concern for COVID19. Denies any known or suspected exposure to person with or possibly with COVID19.  Denies any fevers, chills, sweats, body ache, cough, shortness of breath, abdominal pain, diarrhea  -------------------------------------------------------------------------- O: No physical exam performed due to remote telephone encounter.  -------------------------------------------------------------------------- A&P:   Suspected Acute Sinusitis, possible for benign viral etiology at onset - now concern with progression of symptoms, consider 2nd sickening and cannot rule out bacterial  infection. - Reassuring without high risk symptoms - Afebrile, without dyspnea History of bronchitis/COPD but no flare up lately   1. Start empiric Augmentin BID x 10 day course 2. Start Atrovent nasal spray decongestant 2 sprays in each nostril up to 4 times daily for 7 days 3. Other OTC meds as needed for sinus 4. Precautions reviewed, can consider COVID19 test, may schedule online or seek care at urgent care sooner if worsening or new concerns / call office if need  Meds ordered this encounter  Medications  . amoxicillin-clavulanate (AUGMENTIN) 875-125 MG tablet    Sig: Take 1 tablet by mouth 2 (two) times daily. For 10    Dispense:  20 tablet    Refill:  0  . ipratropium (ATROVENT) 0.06 % nasal spray    Sig: Place 2 sprays into both nostrils 4 (four) times daily. For up to 5-7 days then stop.    Dispense:  15 mL    Refill:  0    OPTIONAL RECOMMENDED self quarantine for patient safety for PREVENTION ONLY. It is not required based on current clinical symptoms. If they were to develop fever or worsening shortness of breath, then emphasis on REQUIRED quarantine for up to 7-14 days that could be resolved if fever free >3 days AND if symptoms improving after 7 days.   If symptoms do not resolve or significantly improve OR if WORSENING - fever / cough - or worsening shortness of breath - then should contact us and seek advice on next steps in treatment at home vs where/when to seek care at Urgent Care or Hospital ED for further intervention and possible testing if indicated.  Patient verbalizes understanding with the above medical recommendations including the limitation of remote medical advice.  Specific follow-up / call-back criteria were given for patient to follow-up or seek medical care more urgently if needed.   -  Time spent in direct consultation with patient on phone: 7 minutes  Nobie Putnam, [facility] 09/30/2019, 11:20  AM

## 2019-10-08 ENCOUNTER — Ambulatory Visit (INDEPENDENT_AMBULATORY_CARE_PROVIDER_SITE_OTHER): Payer: Medicare PPO | Admitting: Family Medicine

## 2019-10-08 ENCOUNTER — Other Ambulatory Visit: Payer: Self-pay

## 2019-10-08 ENCOUNTER — Encounter: Payer: Self-pay | Admitting: Family Medicine

## 2019-10-08 DIAGNOSIS — R42 Dizziness and giddiness: Secondary | ICD-10-CM

## 2019-10-08 DIAGNOSIS — Z5329 Procedure and treatment not carried out because of patient's decision for other reasons: Secondary | ICD-10-CM

## 2019-10-08 NOTE — Progress Notes (Signed)
Patient was scheduled for a virtual visit 1:20 today but did not answer phone. It was considered a no show and she was later reached and re-scheduled to later today in afternoon for virtual visit.  Saralyn Pilar, DO [facility] 10/08/2019, 4:36 PM

## 2019-10-08 NOTE — Progress Notes (Signed)
Virtual Visit via Telephone The purpose of this virtual visit is to provide medical care while limiting exposure to the novel coronavirus (COVID19) for both patient and office staff.  Consent was obtained for phone visit:  Yes.   Answered questions that patient had about telehealth interaction:  Yes.   I discussed the limitations, risks, security and privacy concerns of performing an evaluation and management service by telephone. I also discussed with the patient that there may be a patient responsible charge related to this service. The patient expressed understanding and agreed to proceed.  Patient Location: Home Provider Location: [facility])  ---------------------------------------------------------------------- Chief Complaint  Patient presents with  . Dizziness    S: Reviewed CMA documentation. I have called patient and gathered additional HPI as follows:  VERTIGO / Dysequillibrium Follow-up Sinusitis - last visit 09/30/19 - virtual visit with Augmentin and nasal atrovent for sinusitis, she did very well with treatment and the sinuses have resolved, now no more pain or congestion or drainage. She has 2 more days of augmentin left approx. She also had Bactrim antibiotic she did not take, she was considering taking it after this current antibiotic, she saw urgent care before and they gave her that one. - Now for past few days has had worsening imbalance dizziness vertigo saying her "balance is off", worse with some movements. Not taking anything yet for it. Denies any focal weakness, numbness or pain, headache loss of hearing vision  Denies any known or suspected exposure to person with or possibly with COVID19.  Denies any fevers, chills, sweats, body ache, cough, shortness of breath, sinus pain or pressure, headache, abdominal pain, diarrhea  Past Medical History:  Diagnosis Date  . Allergy   . Colon polyps    Social History   Tobacco Use  . Smoking  status: Current Every Day Smoker    Packs/day: 1.00    Years: 53.00    Pack years: 53.00    Types: Cigarettes  . Smokeless tobacco: Never Used  . Tobacco comment: she quit for 8 years in 1980s and would like to quit.  Meds not effective in past.  Substance Use Topics  . Alcohol use: Yes    Alcohol/week: 2.0 standard drinks    Types: 2 Cans of beer per week    Comment: occasionally  . Drug use: No    Current Outpatient Medications:  .  B Complex-C-E-Zn (B COMPLEX-C-E-ZINC) tablet, Take 1 tablet by mouth daily., Disp: , Rfl:  .  cetirizine (ZYRTEC) 10 MG tablet, Take by mouth., Disp: , Rfl:  .  Cholecalciferol (VITAMIN D3) 1000 units CAPS, Take by mouth., Disp: , Rfl:  .  Collagen 500 MG CAPS, Take by mouth., Disp: , Rfl:  .  ibuprofen (ADVIL,MOTRIN) 200 MG tablet, Take by mouth., Disp: , Rfl:  .  Krill Oil 300 MG CAPS, Take by mouth., Disp: , Rfl:  .  Omega-3 Fatty Acids (FISH OIL) 1200 MG CAPS, Take by mouth., Disp: , Rfl:  .  promethazine (PHENERGAN) 25 MG tablet, , Disp: , Rfl:   Depression screen [facility] 2/9 09/30/2019 01/22/2019 10/10/2017  Decreased Interest 0 0 0  Down, Depressed, Hopeless 0 0 0  PHQ - 2 Score 0 0 0  Altered sleeping - - -  Tired, decreased energy - - -  Change in appetite - - -  Feeling bad or failure about yourself  - - -  Trouble concentrating - - -  Moving slowly or fidgety/restless - - -  Suicidal thoughts - - -  PHQ-9 Score - - -  Difficult doing work/chores - - -    No flowsheet data found.  -------------------------------------------------------------------------- O: No physical exam performed due to remote telephone encounter.  Lab results reviewed.  No results found for this or any previous visit (from the past 2160 hour(s)).  -------------------------------------------------------------------------- A&P:  Problem List Items Addressed This Visit    None    Visit Diagnoses    Vertigo    -  Primary     Suspected BPPV by history with  recent sinusitis likely trigger, already treated on augmentin now resolving - No other significant neurological findings or focal deficits by history  Plan: 1. Finish Augmentin. Do not take Bactrim from Urgent care 2. Advised on Home Epley Maneuver - where to look up and how to do 1-3 times a day until resolved over 1-2 weeks 3. Offer meclizine PRN for dizziness, she declined, call if need to re order Return criteria, if not improved consider vestibular PT referral  No orders of the defined types were placed in this encounter.   Follow-up: - Return in 1-2 weeks if not improved  Patient verbalizes understanding with the above medical recommendations including the limitation of remote medical advice.  Specific follow-up and call-back criteria were given for patient to follow-up or seek medical care more urgently if needed.   - Time spent in direct consultation with patient on phone: 9 minutes   Nobie Putnam, [facility] 10/08/2019, 3:45 PM

## 2019-10-08 NOTE — Patient Instructions (Signed)
1. You have symptoms of Vertigo (Benign Paroxysmal Positional Vertigo) - This is commonly caused by inner ear fluid imbalance, sometimes can be worsened by allergies and sinus symptoms, otherwise it can occur randomly sometimes and we may never discover the exact cause. - To treat this, try the Epley Manuever (see diagrams/instructions below) at home up to 3 times a day for 1-2 weeks or until symptoms resolve - You may take Meclizine as needed up to 3 times a day for dizziness, this will not cure symptoms but may help. Caution may make you drowsy.  If you develop significant worsening episode with vertigo that does not improve and you get severe headache, loss of vision, arm or leg weakness, slurred speech, or other concerning symptoms please seek immediate medical attention at Emergency Department.  Please schedule a follow-up appointment with Dr Althea Charon within 4 weeks if Vertigo not improving, and will consider Referral to Vestibular Rehab  See the next page for images describing the Epley Manuever.     ----------------------------------------------------------------------------------------------------------------------

## 2019-10-14 ENCOUNTER — Other Ambulatory Visit: Payer: Self-pay

## 2019-10-14 ENCOUNTER — Encounter: Payer: Self-pay | Admitting: Family Medicine

## 2019-10-14 ENCOUNTER — Ambulatory Visit: Payer: Medicare PPO | Admitting: Family Medicine

## 2019-10-14 VITALS — BP 136/64 | HR 80 | Temp 97.7°F | Resp 16 | Ht 67.5 in | Wt 168.0 lb

## 2019-10-14 DIAGNOSIS — H6593 Unspecified nonsuppurative otitis media, bilateral: Secondary | ICD-10-CM

## 2019-10-14 DIAGNOSIS — H8393 Unspecified disease of inner ear, bilateral: Secondary | ICD-10-CM | POA: Diagnosis not present

## 2019-10-14 NOTE — Patient Instructions (Addendum)
Thank you for coming to the office today.  If not improving, call us back and we can refer you to a Vestibular rehab therapist OR and ENT specialist.  START Nasocort 2 sprays each nostril once daily for several weeks.  - This is commonly caused by inner ear fluid imbalance, sometimes can be worsened by allergies and sinus symptoms, otherwise it can occur randomly sometimes and we may never discover the exact cause. - To treat this, try the Epley Manuever (see diagrams/instructions below) at home up to 3 times a day for 1-2 weeks or until symptoms resolve - You may take Meclizine as needed up to 3 times a day for dizziness, this will not cure symptoms but may help. Caution may make you drowsy.  If you develop significant worsening episode with vertigo that does not improve and you get severe headache, loss of vision, arm or leg weakness, slurred speech, or other concerning symptoms please seek immediate medical attention at Emergency Department.   See the next page for images describing the Epley Manuever.     ----------------------------------------------------------------------------------------------------------------------         Please schedule a Follow-up Appointment to: Return in about 4 weeks (around 11/11/2019), or if symptoms worsen or fail to improve, for inner ear imbalance.  If you have any other questions or concerns, please feel free to call the office or send a message through MyChart. You may also schedule an earlier appointment if necessary.  Additionally, you may be receiving a survey about your experience at our office within a few days to 1 week by e-mail or mail. We value your feedback.  Saralyn Pilar, DO [facility], [place]

## 2019-10-14 NOTE — Progress Notes (Signed)
Subjective:    Patient ID: Kristine Le, female    DOB: Dec 04, 1940, 79 y.o.   MRN: [id number]  Kristine Le is a 79 y.o. female presenting on 10/14/2019 for Gait Problem (onset month went to urgent care diagnosed with sinusitis and fluids in ears, AUGMENTIN was Rx as per patient is not improving her Sxs)   HPI   Inner Ear Dysfunction / Ear Effusion Recent course, Urgent Care - took prednisone course and held off on antibiotic from UC bactrim but did not take Virtual visit 09/30/19 - she talked to me about sinusitis again, was switched to Augmentin course and nasal decongestant spray with some relief Now sinusitis was resolving - Then virtual visit on 10/08/19 - she was dx and treated for vertigo but did not follow through with Epley maneuver For past 2 days, walking better, more steady. She has been taking slower movements and it has been better, but still has persistent. Seems to have sense of dysequilibrium with faster quicker movements. - was on nasocort, some temporary relief, now off of it, needs to resume - tried atrovent for 5-7 days for decongestant Denies room spinning or nausea lightheaded dizzy, hearing loss   Depression screen [facility] 2/9 09/30/2019 01/22/2019 10/10/2017  Decreased Interest 0 0 0  Down, Depressed, Hopeless 0 0 0  PHQ - 2 Score 0 0 0  Altered sleeping - - -  Tired, decreased energy - - -  Change in appetite - - -  Feeling bad or failure about yourself  - - -  Trouble concentrating - - -  Moving slowly or fidgety/restless - - -  Suicidal thoughts - - -  PHQ-9 Score - - -  Difficult doing work/chores - - -    Social History   Tobacco Use  . Smoking status: Current Every Day Smoker    Packs/day: 1.00    Years: 53.00    Pack years: 53.00    Types: Cigarettes  . Smokeless tobacco: Never Used  . Tobacco comment: she quit for 8 years in 1980s and would like to quit.  Meds not effective in past.  Substance Use Topics  . Alcohol use: Yes    Alcohol/week: 2.0  standard drinks    Types: 2 Cans of beer per week    Comment: occasionally  . Drug use: No    Review of Systems Per HPI unless specifically indicated above     Objective:    BP 136/64   Pulse 80   Temp 97.7 F (36.5 C) (Oral)   Resp 16   Ht 5' 7.5" (1.715 m)   Wt 168 lb (76.2 kg)   SpO2 98%   BMI 25.92 kg/m   Wt Readings from Last 3 Encounters:  10/14/19 168 lb (76.2 kg)  04/16/19 169 lb 6.4 oz (76.8 kg)  10/19/18 176 lb (79.8 kg)    Physical Exam Vitals and nursing note reviewed.  Constitutional:      General: She is not in acute distress.    Appearance: She is well-developed. She is not diaphoretic.     Comments: Well-appearing, comfortable, cooperative  HENT:     Head: Normocephalic and atraumatic.     Right Ear: Ear canal and external ear normal.     Left Ear: Ear canal and external ear normal.     Ears:     Comments: Bilateral TM with effusion clear with some fullness, without obvious bulging, R>L cloudy, no cerumen or erythema Eyes:     General:  Right eye: No discharge.        Left eye: No discharge.     Conjunctiva/sclera: Conjunctivae normal.  Cardiovascular:     Rate and Rhythm: Normal rate.  Pulmonary:     Effort: Pulmonary effort is normal.  Musculoskeletal:     Cervical back: Normal range of motion and neck supple.  Lymphadenopathy:     Cervical: No cervical adenopathy.  Skin:    General: Skin is warm and dry.     Findings: No erythema or rash.  Neurological:     Mental Status: She is alert and oriented to person, place, and time.  Psychiatric:        Behavior: Behavior normal.     Comments: Well groomed, good eye contact, normal speech and thoughts        Results for orders placed or performed in visit on 12/27/17  COMPLETE METABOLIC PANEL WITH GFR  Result Value Ref Range   Glucose, Bld 97 65 - 99 mg/dL   BUN 12 7 - 25 mg/dL   Creat 0.53 9.76 - 7.34 mg/dL   GFR, Est Non African American 82 > OR = 60 mL/min/1.49m2   GFR, Est  African American 95 > OR = 60 mL/min/1.8m2   BUN/Creatinine Ratio NOT APPLICABLE 6 - 22 (calc)   Sodium 140 135 - 146 mmol/L   Potassium 4.7 3.5 - 5.3 mmol/L   Chloride 104 98 - 110 mmol/L   CO2 30 20 - 32 mmol/L   Calcium 9.5 8.6 - 10.4 mg/dL   Total Protein 6.7 6.1 - 8.1 g/dL   Albumin 4.6 3.6 - 5.1 g/dL   Globulin 2.1 1.9 - 3.7 g/dL (calc)   AG Ratio 2.2 1.0 - 2.5 (calc)   Total Bilirubin 0.6 0.2 - 1.2 mg/dL   Alkaline phosphatase (APISO) 57 33 - 130 U/L   AST 27 10 - 35 U/L   ALT 33 (H) 6 - 29 U/L  Lipid panel  Result Value Ref Range   Cholesterol 166 <200 mg/dL   HDL 33 (L) >19 mg/dL   Triglycerides 379 (H) <150 mg/dL   LDL Cholesterol (Calc) 95 mg/dL (calc)   Total CHOL/HDL Ratio 5.0 (H) <5.0 (calc)   Non-HDL Cholesterol (Calc) 133 (H) <130 mg/dL (calc)  TSH  Result Value Ref Range   TSH 3.36 0.40 - 4.50 mIU/L  CBC with Differential/Platelet  Result Value Ref Range   WBC 6.6 3.8 - 10.8 Thousand/uL   RBC 4.53 3.80 - 5.10 Million/uL   Hemoglobin 14.9 11.7 - 15.5 g/dL   HCT 02.4 09.7 - 35.3 %   MCV 97.8 80.0 - 100.0 fL   MCH 32.9 27.0 - 33.0 pg   MCHC 33.6 32.0 - 36.0 g/dL   RDW 29.9 24.2 - 68.3 %   Platelets 136 (L) 140 - 400 Thousand/uL   MPV 11.8 7.5 - 12.5 fL   Neutro Abs 3,307 1,500 - 7,800 cells/uL   Lymphs Abs 2,482 850 - 3,900 cells/uL   WBC mixed population 561 200 - 950 cells/uL   Eosinophils Absolute 231 15 - 500 cells/uL   Basophils Absolute 20 0 - 200 cells/uL   Neutrophils Relative % 50.1 %   Total Lymphocyte 37.6 %   Monocytes Relative 8.5 %   Eosinophils Relative 3.5 %   Basophils Relative 0.3 %      Assessment & Plan:   Problem List Items Addressed This Visit    None    Visit Diagnoses    Inner ear  dysfunction, bilateral    -  Primary   Middle ear effusion, bilateral          Clinically persistent ear effusion bilateral, and underlying inner ear dysfunction dysequilibrium symptoms Not consistent with typical vertigo but has similar  provoked nature Resolving sinusitis now, off antibiotics Limited relief on oral steroid Off nasal steroid  Plan Restart Nasocort for 4-6 weeks Use allergy anti histamine Printed home epley maneuver, can try meclizine PRN if need If not improving will refer to vestibular rehab vs ENT next   No orders of the defined types were placed in this encounter.    Follow up plan: Return in about 4 weeks (around 11/11/2019), or if symptoms worsen or fail to improve, for inner ear imbalance.   Saralyn Pilar, DO [facility] 10/14/2019, 11:52 AM

## 2019-10-18 ENCOUNTER — Telehealth: Payer: Self-pay | Admitting: *Deleted

## 2019-10-18 NOTE — Telephone Encounter (Signed)
Left message for patient to notify them that it is time to schedule annual low dose lung cancer screening CT scan. Instructed patient to call back to verify information prior to the scan being scheduled.  

## 2019-10-25 ENCOUNTER — Telehealth: Payer: Self-pay | Admitting: *Deleted

## 2019-10-25 NOTE — Telephone Encounter (Signed)
Left message for patient to notify them that it is time to schedule annual low dose lung cancer screening CT scan. Instructed patient to call back to verify information prior to the scan being scheduled.  

## 2019-10-25 NOTE — Telephone Encounter (Signed)
Patient returned call and requests to wait until warmer weather to consider lung screening scan. Patient has my contact number and will call when she is ready to schedule.

## 2019-11-25 ENCOUNTER — Other Ambulatory Visit: Payer: Self-pay

## 2019-11-25 ENCOUNTER — Encounter: Payer: Self-pay | Admitting: Podiatry

## 2019-11-25 ENCOUNTER — Ambulatory Visit: Payer: Medicare PPO

## 2019-11-25 ENCOUNTER — Ambulatory Visit: Payer: Medicare PPO | Admitting: Podiatry

## 2019-11-25 DIAGNOSIS — M778 Other enthesopathies, not elsewhere classified: Secondary | ICD-10-CM | POA: Diagnosis not present

## 2019-11-25 DIAGNOSIS — Z720 Tobacco use: Secondary | ICD-10-CM | POA: Insufficient documentation

## 2019-11-25 DIAGNOSIS — M722 Plantar fascial fibromatosis: Secondary | ICD-10-CM

## 2019-11-25 DIAGNOSIS — N393 Stress incontinence (female) (male): Secondary | ICD-10-CM | POA: Insufficient documentation

## 2019-11-25 DIAGNOSIS — M7752 Other enthesopathy of left foot: Secondary | ICD-10-CM | POA: Diagnosis not present

## 2019-11-25 DIAGNOSIS — J309 Allergic rhinitis, unspecified: Secondary | ICD-10-CM | POA: Insufficient documentation

## 2019-11-26 ENCOUNTER — Encounter: Payer: Self-pay | Admitting: Podiatry

## 2019-11-26 NOTE — Progress Notes (Signed)
Subjective:  Patient ID: Kristine Le, female    DOB: 30-Sep-1940,  MRN: [id number]  Chief Complaint  Patient presents with  . Callouses    Patient presents today for painful callous lesions bilat heels and left 5th met x years off and on.  She reports the left is much worse than the right and feel like a stone bruise when walking.  She has been using a pumma stone and otc callous cream    79 y.o. female presents with the above complaint.  Patient presents with left heel pain as well as left fifth tailor's bunion pain.  Patient states that they have been going on for a long period of time for years.  The pain is on and off.  Patient states the left side is greater than the right side.  The right side patient has calluses that have been very painful to walk on.  Patient states it feels like walking on a stone/bruising.  Patient stressed pumice stone over-the-counter callus cream but has not helped.  He would like to know if there is anything else that could be done for this.  He would like to have them debrided down as well.  He denies any other acute complaints.   Review of Systems: Negative except as noted in the HPI. Denies N/V/F/Ch.  Past Medical History:  Diagnosis Date  . Allergy   . Colon polyps     Current Outpatient Medications:  .  B Complex-C-E-Zn (B COMPLEX-C-E-ZINC) tablet, Take 1 tablet by mouth daily., Disp: , Rfl:  .  cetirizine (ZYRTEC) 10 MG tablet, Take by mouth., Disp: , Rfl:  .  Cholecalciferol (VITAMIN D3) 1000 units CAPS, Take by mouth., Disp: , Rfl:  .  Collagen 500 MG CAPS, Take by mouth., Disp: , Rfl:  .  ibuprofen (ADVIL,MOTRIN) 200 MG tablet, Take by mouth., Disp: , Rfl:   Social History   Tobacco Use  Smoking Status Current Every Day Smoker  . Packs/day: 1.00  . Years: 53.00  . Pack years: 53.00  . Types: Cigarettes  Smokeless Tobacco Never Used  Tobacco Comment   she quit for 8 years in 1980s and would like to quit.  Meds not effective in past.     Allergies  Allergen Reactions  . Codeine Nausea And Vomiting    Pt does not want to take any additional codeine products.   Objective:  There were no vitals filed for this visit. There is no height or weight on file to calculate BMI. Constitutional Well developed. Well nourished.  Vascular Dorsalis pedis pulses palpable bilaterally. Posterior tibial pulses palpable bilaterally. Capillary refill normal to all digits.  No cyanosis or clubbing noted. Pedal hair growth normal.  Neurologic Normal speech. Oriented to person, place, and time. Epicritic sensation to light touch grossly present bilaterally.  Dermatologic Nails well groomed and normal in appearance. No open wounds. No skin lesions.  Orthopedic:  Pain on palpation to the left heel.  No pain at the plantar fascial insertion.  The pain is on the lateral aspect of the heel.  No pain at the Achilles tendon insertion no pain at the peroneal or the ATFL region. Left fifth metatarsophalangeal joint pain.  Mild pain with range of motion active and passive of the fifth digit..  Tailor's bunion clinically noted.   Radiographs: Bilateral views of skeletally mature adult foot noted 3 views: No bony abnormalities identified.  Mild tailor's bunion noted with increase in intermetatarsal angle and mild increase in lateral deviation angle.  Mild posterior and plantar heel spurring noted to the left side.  No other abnormalities identified. Assessment:   1. Capsulitis of left foot   2. Capsulitis of metatarsophalangeal (MTP) joint of left foot    Plan:  Patient was evaluated and treated and all questions answered.  Left heel capsulitis -I explained to the patient the etiology of capsulitis and various treatment options associated with it.  I believe this is likely due to plantar fat pad atrophy that is causing a lot of excessive pressure to the heel.  This is also in turn leading to antalgic gait.  I believe patient will benefit from  steroid injection to help decrease the inflammatory component of the capsulitis.  Patient agrees with the plan would like to proceed with injection. -A steroid injection was performed at left heel using 1% plain Lidocaine and 10 mg of Kenalog. This was well tolerated.   Left fifth metatarsal phalangeal joint capsulitis secondary to tailor's bunion -I explained to the patient the etiology of capsulitis especially in the setting of tailor's bunion.  I connected this to how this is related to the way patient is walking including the gait mechanics of the foot.  I believe patient will benefit from a steroid injection in this area to help decrease inflammation Tory component of the capsulitis.  Patient agrees with the plan would like to proceed with injection -A steroid injection was performed at left fifth MTPJ using 1% plain Lidocaine and 10 mg of Kenalog. This was well tolerated.   No follow-ups on file.

## 2019-11-27 ENCOUNTER — Ambulatory Visit: Payer: Self-pay | Admitting: Podiatry

## 2020-01-28 ENCOUNTER — Ambulatory Visit (INDEPENDENT_AMBULATORY_CARE_PROVIDER_SITE_OTHER): Payer: Medicare PPO

## 2020-01-28 DIAGNOSIS — Z0000 Encounter for general adult medical examination without abnormal findings: Secondary | ICD-10-CM | POA: Diagnosis not present

## 2020-01-28 NOTE — Patient Instructions (Signed)
Kristine Le , Thank you for taking time to come for your Medicare Wellness Visit. I appreciate your ongoing commitment to your health goals. Please review the following plan we discussed and let me know if I can assist you in the future.   Screening recommendations/referrals: Colonoscopy: no longer required  Mammogram: no longer required  Bone Density: no longer required  Recommended yearly ophthalmology/optometry visit for glaucoma screening and checkup Recommended yearly dental visit for hygiene and checkup  Vaccinations: Influenza vaccine: due 05/2020 Pneumococcal vaccine: up to date  Tdap vaccine: due now  Shingles vaccine: shingrix completed    Covid-19:completed   Advanced directives: please pick up a copy of his information next time you are in the office.   Conditions/risks identified: Please schedule lung cancer screening.   Next appointment: Follow up in one year for your annual wellness visit.    Preventive Care 26 Years and Older, Female Preventive care refers to lifestyle choices and visits with your health care provider that can promote health and wellness. What does preventive care include?  A yearly physical exam. This is also called an annual well check.  Dental exams once or twice a year.  Routine eye exams. Ask your health care provider how often you should have your eyes checked.  Personal lifestyle choices, including:  Daily care of your teeth and gums.  Regular physical activity.  Eating a healthy diet.  Avoiding tobacco and drug use.  Limiting alcohol use.  Practicing safe sex.  Taking low-dose aspirin every day.  Taking vitamin and mineral supplements as recommended by your health care provider. What happens during an annual well check? The services and screenings done by your health care provider during your annual well check will depend on your age, overall health, lifestyle risk factors, and family history of disease. Counseling  Your  health care provider may ask you questions about your:  Alcohol use.  Tobacco use.  Drug use.  Emotional well-being.  Home and relationship well-being.  Sexual activity.  Eating habits.  History of falls.  Memory and ability to understand (cognition).  Work and work Astronomer.  Reproductive health. Screening  You may have the following tests or measurements:  Height, weight, and BMI.  Blood pressure.  Lipid and cholesterol levels. These may be checked every 5 years, or more frequently if you are over 47 years old.  Skin check.  Lung cancer screening. You may have this screening every year starting at age 26 if you have a 30-pack-year history of smoking and currently smoke or have quit within the past 15 years.  Fecal occult blood test (FOBT) of the stool. You may have this test every year starting at age 75.  Flexible sigmoidoscopy or colonoscopy. You may have a sigmoidoscopy every 5 years or a colonoscopy every 10 years starting at age 73.  Hepatitis C blood test.  Hepatitis B blood test.  Sexually transmitted disease (STD) testing.  Diabetes screening. This is done by checking your blood sugar (glucose) after you have not eaten for a while (fasting). You may have this done every 1-3 years.  Bone density scan. This is done to screen for osteoporosis. You may have this done starting at age 46.  Mammogram. This may be done every 1-2 years. Talk to your health care provider about how often you should have regular mammograms. Talk with your health care provider about your test results, treatment options, and if necessary, the need for more tests. Vaccines  Your health care provider  may recommend certain vaccines, such as:  Influenza vaccine. This is recommended every year.  Tetanus, diphtheria, and acellular pertussis (Tdap, Td) vaccine. You may need a Td booster every 10 years.  Zoster vaccine. You may need this after age 72.  Pneumococcal 13-valent  conjugate (PCV13) vaccine. One dose is recommended after age 33.  Pneumococcal polysaccharide (PPSV23) vaccine. One dose is recommended after age 71. Talk to your health care provider about which screenings and vaccines you need and how often you need them. This information is not intended to replace advice given to you by your health care provider. Make sure you discuss any questions you have with your health care provider. Document Released: 10/02/2015 Document Revised: 05/25/2016 Document Reviewed: 07/07/2015 Elsevier Interactive Patient Education  2017 [place] Prevention in the Home Falls can cause injuries. They can happen to people of all ages. There are many things you can do to make your home safe and to help prevent falls. What can I do on the outside of my home?  Regularly fix the edges of walkways and driveways and fix any cracks.  Remove anything that might make you trip as you walk through a door, such as a raised step or threshold.  Trim any bushes or trees on the path to your home.  Use bright outdoor lighting.  Clear any walking paths of anything that might make someone trip, such as rocks or tools.  Regularly check to see if handrails are loose or broken. Make sure that both sides of any steps have handrails.  Any raised decks and porches should have guardrails on the edges.  Have any leaves, snow, or ice cleared regularly.  Use sand or salt on walking paths during winter.  Clean up any spills in your garage right away. This includes oil or grease spills. What can I do in the bathroom?  Use night lights.  Install grab bars by the toilet and in the tub and shower. Do not use towel bars as grab bars.  Use non-skid mats or decals in the tub or shower.  If you need to sit down in the shower, use a plastic, non-slip stool.  Keep the floor dry. Clean up any water that spills on the floor as soon as it happens.  Remove soap buildup in the tub or  shower regularly.  Attach bath mats securely with double-sided non-slip rug tape.  Do not have throw rugs and other things on the floor that can make you trip. What can I do in the bedroom?  Use night lights.  Make sure that you have a light by your bed that is easy to reach.  Do not use any sheets or blankets that are too big for your bed. They should not hang down onto the floor.  Have a firm chair that has side arms. You can use this for support while you get dressed.  Do not have throw rugs and other things on the floor that can make you trip. What can I do in the kitchen?  Clean up any spills right away.  Avoid walking on wet floors.  Keep items that you use a lot in easy-to-reach places.  If you need to reach something above you, use a strong step stool that has a grab bar.  Keep electrical cords out of the way.  Do not use floor polish or wax that makes floors slippery. If you must use wax, use non-skid floor wax.  Do not have throw rugs  and other things on the floor that can make you trip. What can I do with my stairs?  Do not leave any items on the stairs.  Make sure that there are handrails on both sides of the stairs and use them. Fix handrails that are broken or loose. Make sure that handrails are as long as the stairways.  Check any carpeting to make sure that it is firmly attached to the stairs. Fix any carpet that is loose or worn.  Avoid having throw rugs at the top or bottom of the stairs. If you do have throw rugs, attach them to the floor with carpet tape.  Make sure that you have a light switch at the top of the stairs and the bottom of the stairs. If you do not have them, ask someone to add them for you. What else can I do to help prevent falls?  Wear shoes that:  Do not have high heels.  Have rubber bottoms.  Are comfortable and fit you well.  Are closed at the toe. Do not wear sandals.  If you use a stepladder:  Make sure that it is fully  opened. Do not climb a closed stepladder.  Make sure that both sides of the stepladder are locked into place.  Ask someone to hold it for you, if possible.  Clearly mark and make sure that you can see:  Any grab bars or handrails.  First and last steps.  Where the edge of each step is.  Use tools that help you move around (mobility aids) if they are needed. These include:  Canes.  Walkers.  Scooters.  Crutches.  Turn on the lights when you go into a dark area. Replace any light bulbs as soon as they burn out.  Set up your furniture so you have a clear path. Avoid moving your furniture around.  If any of your floors are uneven, fix them.  If there are any pets around you, be aware of where they are.  Review your medicines with your doctor. Some medicines can make you feel dizzy. This can increase your chance of falling. Ask your doctor what other things that you can do to help prevent falls. This information is not intended to replace advice given to you by your health care provider. Make sure you discuss any questions you have with your health care provider. Document Released: 07/02/2009 Document Revised: 02/11/2016 Document Reviewed: 10/10/2014 Elsevier Interactive Patient Education  2017 [employer].

## 2020-01-28 NOTE — Progress Notes (Signed)
Subjective:   Kristine Le is a 79 y.o. female who presents for Medicare Annual (Subsequent) preventive examination.  Review of Systems:   Cardiac Risk Factors include: advanced age (>82men, >64 women);dyslipidemia;hypertension     Objective:     Vitals: There were no vitals taken for this visit.  There is no height or weight on file to calculate BMI.  Advanced Directives 01/22/2019 10/10/2017  Does Patient Have a Medical Advance Directive? No No  Would patient like information on creating a medical advance directive? Yes (MAU/Ambulatory/Procedural Areas - Information given) Yes (MAU/Ambulatory/Procedural Areas - Information given)    Tobacco Social History   Tobacco Use  Smoking Status Current Every Day Smoker  . Packs/day: 1.00  . Years: 53.00  . Pack years: 53.00  . Types: Cigarettes  Smokeless Tobacco Never Used  Tobacco Comment   she quit for 8 years in 1980s and would like to quit.  Meds not effective in past.     Ready to quit: Not Answered Counseling given: Not Answered Comment: she quit for 8 years in 1980s and would like to quit.  Meds not effective in past.   Clinical Intake:  Pre-visit preparation completed: Yes  Pain : No/denies pain     Nutritional Risks: None Diabetes: No  How often do you need to have someone help you when you read instructions, pamphlets, or other written materials from your doctor or pharmacy?: 1 - Never  Interpreter Needed?: No  Information entered by :: Anastacio Bua,LPN  Past Medical History:  Diagnosis Date  . Allergy   . Colon polyps    Past Surgical History:  Procedure Laterality Date  . KNEE SURGERY    . PARTIAL HYSTERECTOMY    . THUMB ARTHROSCOPY     Family History  Problem Relation Age of Onset  . Breast cancer Cousin        maternal  . Kidney cancer Father   . Asthma Mother   . Arthritis Sister   . GER disease Sister   . Diabetes Sister   . Bone cancer Son   . Kidney cancer Son    Social History     Socioeconomic History  . Marital status: Widowed    Spouse name: Not on file  . Number of children: Not on file  . Years of education: Not on file  . Highest education level: High school graduate  Occupational History  . Occupation: retired  Tobacco Use  . Smoking status: Current Every Day Smoker    Packs/day: 1.00    Years: 53.00    Pack years: 53.00    Types: Cigarettes  . Smokeless tobacco: Never Used  . Tobacco comment: she quit for 8 years in 1980s and would like to quit.  Meds not effective in past.  Substance and Sexual Activity  . Alcohol use: Yes    Alcohol/week: 2.0 standard drinks    Types: 2 Cans of beer per week    Comment: occasionally  . Drug use: No  . Sexual activity: Not on file  Other Topics Concern  . Not on file  Social History Narrative  . Not on file   Social Determinants of Health   Financial Resource Strain: Low Risk   . Difficulty of Paying Living Expenses: Not hard at all  Food Insecurity: No Food Insecurity  . Worried About Programme researcher, broadcasting/film/video in the Last Year: Never true  . Ran Out of Food in the Last Year: Never true  Transportation Needs: No  Transportation Needs  . Lack of Transportation (Medical): No  . Lack of Transportation (Non-Medical): No  Physical Activity: Insufficiently Active  . Days of Exercise per Week: 3 days  . Minutes of Exercise per Session: 30 min  Stress:   . Feeling of Stress :   Social Connections: Unknown  . Frequency of Communication with Friends and Family: More than three times a week  . Frequency of Social Gatherings with Friends and Family: More than three times a week  . Attends Religious Services: Never  . Active Member of Clubs or Organizations: No  . Attends Archivist Meetings: Never  . Marital Status: Patient refused    Outpatient Encounter Medications as of 01/28/2020  Medication Sig  . B Complex-C-E-Zn (B COMPLEX-C-E-ZINC) tablet Take 1 tablet by mouth daily.  . Cholecalciferol  (VITAMIN D3) 1000 units CAPS Take by mouth.  . Collagen 500 MG CAPS Take by mouth.  . fexofenadine (ALLEGRA) 60 MG tablet Take 60 mg by mouth 2 (two) times daily.  Marland Kitchen ibuprofen (ADVIL,MOTRIN) 200 MG tablet Take by mouth.  . Zinc Sulfate (ZINC 15 PO) Take by mouth.  . [DISCONTINUED] cetirizine (ZYRTEC) 10 MG tablet Take by mouth.   No facility-administered encounter medications on file as of 01/28/2020.    Activities of Daily Living In your present state of health, do you have any difficulty performing the following activities: 01/28/2020  Hearing? N  Comment no hearing aids  Vision? N  Difficulty concentrating or making decisions? N  Walking or climbing stairs? N  Dressing or bathing? N  Doing errands, shopping? N  Preparing Food and eating ? N  Using the Toilet? N  In the past six months, have you accidently leaked urine? N  Comment discrete pads  Do you have problems with loss of bowel control? N  Managing your Medications? N  Managing your Finances? N  Housekeeping or managing your Housekeeping? N  Some recent data might be hidden    Patient Care Team: Malfi, Lupita Raider, FNP as PCP - General (Family Medicine) Felipa Furnace, DPM as Consulting Physician (Podiatry)    Assessment:   This is a routine wellness examination for [place].  Exercise Activities and Dietary recommendations Current Exercise Habits: Home exercise routine, Time (Minutes): 25, Frequency (Times/Week): 3, Weekly Exercise (Minutes/Week): 75, Intensity: Mild, Exercise limited by: None identified  Goals Addressed   None     Fall Risk: Fall Risk  01/28/2020 09/30/2019 05/28/2019 01/22/2019 10/10/2017  Falls in the past year? 0 0 0 0 No  Number falls in past yr: 0 0 - - -  Injury with Fall? 0 - 0 - -    FALL RISK PREVENTION PERTAINING TO THE HOME:  Any stairs in or around the home? Yes  going in home  If so, are there any without handrails? No   Home free of loose throw rugs in walkways, pet beds, electrical  cords, etc? Yes  Adequate lighting in your home to reduce risk of falls? Yes   ASSISTIVE DEVICES UTILIZED TO PREVENT FALLS:  Life alert? No  Use of a cane, walker or w/c? No  Grab bars in the bathroom? No  Shower chair or bench in shower? No  Elevated toilet seat or a handicapped toilet? No   DME ORDERS:  DME order needed?  No   TIMED UP AND GO:  Unable to perform    Depression Screen PHQ 2/9 Scores 01/28/2020 09/30/2019 01/22/2019 10/10/2017  PHQ - 2 Score 0 0  0 0  PHQ- 9 Score - - - -     Cognitive Function     6CIT Screen 01/22/2019  What Year? 0 points  What month? 0 points  What time? 0 points  Count back from 20 0 points  Months in reverse 0 points  Repeat phrase 0 points  Total Score 0    Immunization History  Administered Date(s) Administered  . Influenza Split 06/24/2014  . Influenza,inj,Quad PF,6+ Mos 07/06/2018  . Influenza-Unspecified 06/07/2012, 06/24/2015, 06/20/2016, 06/27/2017  . Pneumococcal Conjugate-13 12/27/2017  . Pneumococcal Polysaccharide-23 06/24/2014  . Zoster Recombinat (Shingrix) 01/05/2018, 07/06/2018    Qualifies for Shingles Vaccine? Yes shingrix completed   Tdap: Discussed need for TD/TDAP vaccine, patient verbalized understanding that this is not covered as a preventative with there insurance and to call the office if she develops any new skin injuries, ie: cuts, scrapes, bug bites, or open wounds.  Flu Vaccine: due 05/2020  Pneumococcal Vaccine: up to date   Covid-19 Vaccine: declined   Screening Tests Health Maintenance  Topic Date Due  . COVID-19 Vaccine (1) Never done  . TETANUS/TDAP  Never done  . INFLUENZA VACCINE  04/19/2020  . PNA vac Low Risk Adult  Completed  . DEXA SCAN  Discontinued    Cancer Screenings:  Colorectal Screening: no longer required   Mammogram: declined   Bone Density: declined   Lung Cancer Screening: (Low Dose CT Chest recommended if Age 21-80 years, 30 pack-year currently smoking OR have  quit w/in 15years.) does qualify.   Will schedule with Glenna Fellows, RN   Additional Screening:  Hepatitis C Screening: does not qualify  Vision Screening: Recommended annual ophthalmology exams for early detection of glaucoma and other disorders of the eye. Is the patient up to date with their annual eye exam?  Yes  Who is the provider or what is the name of the office in which the pt attends annual eye exams? Dr.Shade   Dental Screening: Recommended annual dental exams for proper oral hygiene  Community Resource Referral:  CRR required this visit?  No       Plan:  I have personally reviewed and addressed the Medicare Annual Wellness questionnaire and have noted the following in the patient's chart:  A. Medical and social history B. Use of alcohol, tobacco or illicit drugs  C. Current medications and supplements D. Functional ability and status E.  Nutritional status F.  Physical activity G. Advance directives H. List of other physicians I.  Hospitalizations, surgeries, and ER visits in previous 12 months J.  Vitals K. Screenings such as hearing and vision if needed, cognitive and depression L. Referrals and appointments   In addition, I have reviewed and discussed with patient certain preventive protocols, quality metrics, and best practice recommendations. A written personalized care plan for preventive services as well as general preventive health recommendations were provided to patient.  Signed,    Collene Schlichter, LPN  6/[id number]/9373 Nurse Health Advisor   Nurse Notes: none

## 2020-02-25 ENCOUNTER — Encounter: Payer: Self-pay | Admitting: Podiatry

## 2020-02-25 ENCOUNTER — Other Ambulatory Visit: Payer: Self-pay

## 2020-02-25 ENCOUNTER — Ambulatory Visit: Payer: Medicare PPO | Admitting: Podiatry

## 2020-02-25 DIAGNOSIS — M778 Other enthesopathies, not elsewhere classified: Secondary | ICD-10-CM

## 2020-02-25 DIAGNOSIS — Q828 Other specified congenital malformations of skin: Secondary | ICD-10-CM

## 2020-02-25 NOTE — Progress Notes (Signed)
Subjective:  Patient ID: Kristine Le, female    DOB: [birth year],  MRN: [id number]  Chief Complaint  Patient presents with  . Callouses    Patient presents today for painful callous lesion bottom of left heel    79 y.o. female presents with the above complaint.  Patient presents with continuous left heel pain that has been going on for quite some time.  Patient states the injection I gave him last time did help him a lot.  To give him with 3 months of relief.  However he states that the pain is coming back slowly.  He would like to do another injection as well as debride the callus formation.  He states that the submet 5 lesion has resolved with debridement and injection.  He does not any pain over there anymore.  For now he would like to focus only on the heel.  He denies any other acute complaints.   Review of Systems: Negative except as noted in the HPI. Denies N/V/F/Ch.  Past Medical History:  Diagnosis Date  . Allergy   . Colon polyps     Current Outpatient Medications:  .  B Complex-C-E-Zn (B COMPLEX-C-E-ZINC) tablet, Take 1 tablet by mouth daily., Disp: , Rfl:  .  Cholecalciferol (VITAMIN D3) 1000 units CAPS, Take by mouth., Disp: , Rfl:  .  Collagen 500 MG CAPS, Take by mouth., Disp: , Rfl:  .  fexofenadine (ALLEGRA) 60 MG tablet, Take 60 mg by mouth 2 (two) times daily., Disp: , Rfl:  .  ibuprofen (ADVIL,MOTRIN) 200 MG tablet, Take by mouth., Disp: , Rfl:  .  Zinc Sulfate (ZINC 15 PO), Take by mouth., Disp: , Rfl:   Social History   Tobacco Use  Smoking Status Current Every Day Smoker  . Packs/day: 1.00  . Years: 53.00  . Pack years: 53.00  . Types: Cigarettes  Smokeless Tobacco Never Used  Tobacco Comment   she quit for 8 years in 1980s and would like to quit.  Meds not effective in past.    Allergies  Allergen Reactions  . Codeine Nausea And Vomiting    Pt does not want to take any additional codeine products.   Objective:  There were no vitals filed for  this visit. There is no height or weight on file to calculate BMI. Constitutional Well developed. Well nourished.  Vascular Dorsalis pedis pulses palpable bilaterally. Posterior tibial pulses palpable bilaterally. Capillary refill normal to all digits.  No cyanosis or clubbing noted. Pedal hair growth normal.  Neurologic Normal speech. Oriented to person, place, and time. Epicritic sensation to light touch grossly present bilaterally.  Dermatologic Nails well groomed and normal in appearance. No open wounds. Hyperkeratotic lesion with central nucleated core noted.  No pinpoint bleeding noted.  Orthopedic:  Pain on palpation to the left heel.  No pain at the plantar fascial insertion.  The pain is on the lateral aspect of the heel.  No pain at the Achilles tendon insertion no pain at the peroneal or the ATFL region. Left fifth metatarsophalangeal joint pain.  No pain with range of motion active and passive of the fifth digit..  Tailor's bunion clinically noted.   Radiographs: None Assessment:   1. Capsulitis of left foot   2. Porokeratosis    Plan:  Patient was evaluated and treated and all questions answered.  Left heel capsulitis~improving with underlying porokeratosis -I explained to the patient the etiology of capsulitis and various treatment options associated with it.  I believe this  is likely due to plantar fat pad atrophy that is causing a lot of excessive pressure to the heel.  This is also in turn leading to antalgic gait.  I believe patient will benefit from second steroid injection to help decrease the inflammatory component of the capsulitis.  Patient agrees with the plan would like to proceed with injection. -A second steroid injection was performed at left heel using 1% plain Lidocaine and 10 mg of Kenalog. This was well tolerated. -Using chisel blade and a handle of the porokeratosis was aggressively debridement with excision of the central nucleated core.  No  complication noted.  No pinpoint bleeding noted.   Left fifth metatarsal phalangeal joint capsulitis secondary to tailor's bunion -Resolved with 1 steroid injection.   No follow-ups on file.

## 2020-03-06 ENCOUNTER — Telehealth: Payer: Self-pay | Admitting: *Deleted

## 2020-03-06 DIAGNOSIS — Z122 Encounter for screening for malignant neoplasm of respiratory organs: Secondary | ICD-10-CM

## 2020-03-06 DIAGNOSIS — Z87891 Personal history of nicotine dependence: Secondary | ICD-10-CM

## 2020-03-06 NOTE — Telephone Encounter (Signed)
(  03/06/2020) Left message for pt to notify them that it is time to schedule annual low dose lung cancer screening CT scan. Instructed patient to call back to verify information prior to the scan being scheduled °SRW °  ° ° °

## 2020-03-09 DIAGNOSIS — H2512 Age-related nuclear cataract, left eye: Secondary | ICD-10-CM | POA: Diagnosis not present

## 2020-03-29 ENCOUNTER — Telehealth: Payer: Self-pay

## 2020-03-29 NOTE — Telephone Encounter (Signed)
Contacted patient to schedule her next lung CT screening scan (her last scan was 10/19/18).  Patient was very pleasant and explained that her sister has severe arthritis and she has been focusing her time at getting her sister to and from appointments and managing her care.  She was grateful for the call as she wanted to communicate this with Glenna Fellows, lung navigator, but says she lost his number.  I gave her his number and she states she will call him to schedule her scan when she is at a better place.  I encouraged her to work with Ines Bloomer to find a time that works for her.  She may be able to coordinate her scan and one of her sister's appointments as she states she normally has a long wait in her car while her sister is being treated.

## 2020-03-30 ENCOUNTER — Ambulatory Visit (INDEPENDENT_AMBULATORY_CARE_PROVIDER_SITE_OTHER): Payer: Medicare PPO | Admitting: Family Medicine

## 2020-03-30 ENCOUNTER — Other Ambulatory Visit: Payer: Self-pay

## 2020-03-30 ENCOUNTER — Encounter: Payer: Self-pay | Admitting: Family Medicine

## 2020-03-30 VITALS — BP 125/70 | HR 74 | Temp 97.1°F | Resp 18 | Ht 67.5 in | Wt 169.0 lb

## 2020-03-30 DIAGNOSIS — E782 Mixed hyperlipidemia: Secondary | ICD-10-CM | POA: Diagnosis not present

## 2020-03-30 DIAGNOSIS — R7989 Other specified abnormal findings of blood chemistry: Secondary | ICD-10-CM

## 2020-03-30 DIAGNOSIS — Z79899 Other long term (current) drug therapy: Secondary | ICD-10-CM | POA: Diagnosis not present

## 2020-03-30 DIAGNOSIS — R8290 Unspecified abnormal findings in urine: Secondary | ICD-10-CM | POA: Diagnosis not present

## 2020-03-30 DIAGNOSIS — Z0000 Encounter for general adult medical examination without abnormal findings: Secondary | ICD-10-CM | POA: Insufficient documentation

## 2020-03-30 DIAGNOSIS — K760 Fatty (change of) liver, not elsewhere classified: Secondary | ICD-10-CM | POA: Diagnosis not present

## 2020-03-30 DIAGNOSIS — R635 Abnormal weight gain: Secondary | ICD-10-CM | POA: Diagnosis not present

## 2020-03-30 DIAGNOSIS — D696 Thrombocytopenia, unspecified: Secondary | ICD-10-CM

## 2020-03-30 DIAGNOSIS — R7309 Other abnormal glucose: Secondary | ICD-10-CM | POA: Diagnosis not present

## 2020-03-30 NOTE — Addendum Note (Signed)
Addended by: Jonne Ply on: 03/30/2020 12:10 PM   Modules accepted: Orders

## 2020-03-30 NOTE — Telephone Encounter (Signed)
Patient has been notified that annual lung cancer screening low dose CT scan is due currently or will be in near future. Confirmed that patient is within the age range of 55-77, and asymptomatic, (no signs or symptoms of lung cancer). Patient denies illness that would prevent curative treatment for lung cancer if found. Verified smoking history, (current, 54 pack year). The shared decision making visit was done 10/18/17. Patient is agreeable for CT scan being scheduled.

## 2020-03-30 NOTE — Assessment & Plan Note (Signed)
Annual physical exam without new findings.  Well adult with no acute concerns.  Plan: 1. Obtain health maintenance screenings as above according to age. - Increase physical activity to 30 minutes most days of the week.  - Eat healthy diet high in vegetables and fruits; low in refined carbohydrates. - Screening labs and tests as ordered 2. Return 1 year for annual physical.  

## 2020-03-30 NOTE — Patient Instructions (Signed)
Well Visit: Care Instructions Overview  Well visits can help you stay healthy. Your provider has checked your overall health and may have suggested ways to take good care of yourself. Your provider also may have recommended tests. At home, you can help prevent illness with healthy eating, regular exercise, and other steps.  Follow-up care is a key part of your treatment and safety. Be sure to make and go to all appointments, and call your provider if you are having problems. It's also a good idea to know your test results and keep a list of the medicines you take.  How can you care for yourself at home?   Get screening tests that you and your doctor decide on. Screening helps find diseases before any symptoms appear.   Eat healthy foods. Choose fruits, vegetables, whole grains, protein, and low-fat dairy foods. Limit fat, especially saturated fat. Reduce salt in your diet.   Limit alcohol. If you are a man, have no more than 2 drinks a day or 14 drinks a week. If you are a woman, have no more than 1 drink a day or 7 drinks a week.   Get at least 30 minutes of physical activity on most days of the week.  We recommend you go no more than 2 days in a row without exercise. Walking is a good choice. You also may want to do other activities, such as running, swimming, cycling, or playing tennis or team sports. Discuss any changes in your exercise program with your provider.   Reach and stay at a healthy weight. This will lower your risk for many problems, such as obesity, diabetes, heart disease, and high blood pressure.   Do not smoke or allow others to smoke around you. If you need help quitting, talk to your provider about stop-smoking programs and medicines. These can increase your chances of quitting for good.  Can call 1-800-QUIT-NOW (431-730-9947) for the [facility], assistance with smoking cessation.   Care for your mental health. It is easy to get weighed down by worry  and stress. Learn strategies to manage stress, like deep breathing and mindfulness, and stay connected with your family and community. If you find you often feel sad or hopeless, talk with your provider. Treatment can help.   Talk to your provider about whether you have any risk factors for sexually transmitted infections (STIs). You can help prevent STIs if you wait to have sex with a new partner (or partners) until you've each been tested for STIs. It also helps if you use condoms (female or female condoms) and if you limit your sex partners to one person who only has sex with you. Vaccines are available for some STIs, such as HPV (these are age dependent).   Use birth control if it's important to you to prevent pregnancy. Talk with your provider about the choices available and what might be best for you.   If you think you may have a problem with alcohol or drug use, talk to your provider. This includes prescription medicines (such as amphetamines and opioids) and illegal drugs (such as cocaine and methamphetamine). Your provider can help you figure out what type of treatment is best for you.   If you have concerns about domestic violence or intimate partner violence, there are resources available to you. National Domestic Abuse Hotline (604)386-2492   Protect your skin from too much sun. When you're outdoors from 10 a.m. to 4 p.m., stay in the shade or cover up  with clothing and a hat with a wide brim. Wear sunglasses that block UV rays. Even when it's cloudy, put broad-spectrum sunscreen (SPF 30 or higher) on any exposed skin.   See a dentist one or two times a year for checkups and to have your teeth cleaned.   See an eye doctor once per year for an eye exam.   Wear a seat belt in the car.  When should you call for help?  Watch closely for changes in your health, and be sure to contact your provider if you have any problems or symptoms that concern you.  We will plan to see you back  in 1 year for next physical  You will receive a survey after today's visit either digitally by e-mail or paper by [employer]. Your experiences and feedback matter to [place].  Please respond so we know how we are doing as we provide care for you.  Call us with any questions/concerns/needs.  It is my goal to be available to you for your health concerns.  Thanks for choosing me to be a partner in your healthcare needs!  Charlaine Dalton, FNP-C Family Nurse Practitioner [facility] Phone: [phone]

## 2020-03-30 NOTE — Progress Notes (Signed)
Subjective:    Patient ID: Kristine Le, female    DOB: [birth year], 79 y.o.   MRN: [id number]  Kristine Le is a 79 y.o. female presenting on 03/30/2020 for Annual Exam   HPI  HEALTH MAINTENANCE:  Weight/BMI: Overweight, BMI 26.08 Physical activity: Stays active Diet: Regular Seatbelt: Yes Sunscreen: No, reports not in sun long enough to need any Mammogram: Declines DEXA: Declines HIV & Hep C Screening: Offered and declined GC/CT: Offered and declined Optometry: Regularly Dentistry: Every 6 months  IMMUNIZATIONS: Influenza: Due next season Tetanus: Due Shingles: Shingrix 07/06/2018, 01/05/2018 COVID: Discussed Pneumonia: Pneumonococcal 13 - 12/27/2017, Pneumococcal 23 - 06/24/2014  STOP BANG SCREENING Snoring: Do you snore loudly? yes/no: No Tired: Do you often feel tired, fatigued, sleeping during the daytime? yes/no: No Observed: Has anyone observed you stop breathing or choking/gasping during your sleep? yes/no: No Pressure: Do you have or being treated for high blood pressure? yes/no: No BMI: Greater than 35? yes/no: No Age: Older than 50? yes/no: Yes Neck Side: Greater than 17 (males)/16 (females)? yes/no: No Gender: Born as female gender? yes/no: No  Depression screen [facility] 2/9 01/28/2020 09/30/2019 01/22/2019  Decreased Interest 0 0 0  Down, Depressed, Hopeless 0 0 0  PHQ - 2 Score 0 0 0  Altered sleeping - - -  Tired, decreased energy - - -  Change in appetite - - -  Feeling bad or failure about yourself  - - -  Trouble concentrating - - -  Moving slowly or fidgety/restless - - -  Suicidal thoughts - - -  PHQ-9 Score - - -  Difficult doing work/chores - - -    Past Medical History:  Diagnosis Date  . Allergy   . Colon polyps    Past Surgical History:  Procedure Laterality Date  . KNEE SURGERY    . PARTIAL HYSTERECTOMY    . THUMB ARTHROSCOPY     Social History   Socioeconomic History  . Marital status: Widowed    Spouse name: Not on file  . Number  of children: Not on file  . Years of education: Not on file  . Highest education level: High school graduate  Occupational History  . Occupation: retired  Tobacco Use  . Smoking status: Current Every Day Smoker    Packs/day: 1.00    Years: 53.00    Pack years: 53.00    Types: Cigarettes  . Smokeless tobacco: Never Used  . Tobacco comment: she quit for 8 years in 1980s and would like to quit.  Meds not effective in past.  Vaping Use  . Vaping Use: Never used  Substance and Sexual Activity  . Alcohol use: Yes    Alcohol/week: 2.0 standard drinks    Types: 2 Cans of beer per week    Comment: occasionally  . Drug use: No  . Sexual activity: Not on file  Other Topics Concern  . Not on file  Social History Narrative  . Not on file   Social Determinants of Health   Financial Resource Strain: Low Risk   . Difficulty of Paying Living Expenses: Not hard at all  Food Insecurity: No Food Insecurity  . Worried About Programme researcher, broadcasting/film/video in the Last Year: Never true  . Ran Out of Food in the Last Year: Never true  Transportation Needs: No Transportation Needs  . Lack of Transportation (Medical): No  . Lack of Transportation (Non-Medical): No  Physical Activity: Insufficiently Active  . Days of Exercise per Week:  3 days  . Minutes of Exercise per Session: 30 min  Stress:   . Feeling of Stress :   Social Connections: Unknown  . Frequency of Communication with Friends and Family: More than three times a week  . Frequency of Social Gatherings with Friends and Family: More than three times a week  . Attends Religious Services: Never  . Active Member of Clubs or Organizations: No  . Attends Banker Meetings: Never  . Marital Status: Patient refused  Intimate Partner Violence:   . Fear of Current or Ex-Partner:   . Emotionally Abused:   Marland Kitchen Physically Abused:   . Sexually Abused:    Family History  Problem Relation Age of Onset  . Breast cancer Cousin        maternal    . Kidney cancer Father   . Asthma Mother   . Arthritis Sister   . GER disease Sister   . Diabetes Sister   . Bone cancer Son   . Kidney cancer Son    Current Outpatient Medications on File Prior to Visit  Medication Sig  . B Complex-C-E-Zn (B COMPLEX-C-E-ZINC) tablet Take 1 tablet by mouth daily.  . cetirizine (ZYRTEC) 10 MG tablet Take 10 mg by mouth daily.  . Cholecalciferol (VITAMIN D3) 1000 units CAPS Take by mouth.  . Collagen 500 MG CAPS Take by mouth.  Marland Kitchen ibuprofen (ADVIL,MOTRIN) 200 MG tablet Take by mouth.  . Zinc Sulfate (ZINC 15 PO) Take by mouth.   No current facility-administered medications on file prior to visit.    Per HPI unless specifically indicated above     Objective:    BP 125/70 (BP Location: Left Arm, Patient Position: Sitting, Cuff Size: Normal)   Pulse 74   Temp (!) 97.1 F (36.2 C) (Temporal)   Resp 18   Ht 5' 7.5" (1.715 m)   Wt 169 lb (76.7 kg)   SpO2 98%   BMI 26.08 kg/m   Wt Readings from Last 3 Encounters:  03/30/20 169 lb (76.7 kg)  10/14/19 168 lb (76.2 kg)  04/16/19 169 lb 6.4 oz (76.8 kg)    Physical Exam Vitals reviewed.  Constitutional:      General: She is not in acute distress.    Appearance: Normal appearance. She is well-developed, well-groomed and overweight. She is not ill-appearing or toxic-appearing.  HENT:     Head: Normocephalic and atraumatic.     Right Ear: Tympanic membrane, ear canal and external ear normal. There is no impacted cerumen.     Left Ear: Tympanic membrane, ear canal and external ear normal. There is no impacted cerumen.     Nose: Nose normal. No congestion or rhinorrhea.     Mouth/Throat:     Le: Pink.     Mouth: Mucous membranes are moist.     Tongue: No lesions.     Palate: No mass and lesions.     Pharynx: Oropharynx is clear. Uvula midline. No oropharyngeal exudate or posterior oropharyngeal erythema.     Tonsils: No tonsillar exudate or tonsillar abscesses.  Eyes:     General: Lids are  normal. Vision grossly intact. No scleral icterus.       Right eye: No discharge.        Left eye: No discharge.     Extraocular Movements: Extraocular movements intact.     Conjunctiva/sclera: Conjunctivae normal.     Pupils: Pupils are equal, round, and reactive to light.  Neck:     Thyroid:  No thyroid mass or thyromegaly.  Cardiovascular:     Rate and Rhythm: Normal rate and regular rhythm.     Pulses: Normal pulses.          Dorsalis pedis pulses are 2+ on the right side and 2+ on the left side.     Heart sounds: Normal heart sounds. No murmur heard.  No friction rub. No gallop.   Pulmonary:     Effort: Pulmonary effort is normal. No respiratory distress.     Breath sounds: Normal breath sounds.  Chest:     Comments: Deferred Abdominal:     General: Abdomen is flat. Bowel sounds are normal. There is no distension.     Palpations: Abdomen is soft. There is no hepatomegaly, splenomegaly or mass.     Tenderness: There is no abdominal tenderness. There is no guarding or rebound.     Hernia: No hernia is present.  Musculoskeletal:        General: Normal range of motion.     Cervical back: Normal range of motion and neck supple. No tenderness.     Right lower leg: No edema.     Left lower leg: No edema.     Comments: Normal tone, strength 5/5 BUE & BLE  Feet:     Right foot:     Skin integrity: Skin integrity normal.     Left foot:     Skin integrity: Skin integrity normal.  Lymphadenopathy:     Cervical: No cervical adenopathy.     Upper Body:     Right upper body: No supraclavicular adenopathy.     Left upper body: No supraclavicular adenopathy.     Lower Body: No right inguinal adenopathy. No left inguinal adenopathy.  Skin:    General: Skin is warm and dry.     Capillary Refill: Capillary refill takes less than 2 seconds.  Neurological:     General: No focal deficit present.     Mental Status: She is alert and oriented to person, place, and time.     Cranial Nerves:  No cranial nerve deficit.     Sensory: No sensory deficit.     Motor: No weakness.     Coordination: Coordination normal.     Gait: Gait normal.     Deep Tendon Reflexes: Reflexes normal.  Psychiatric:        Attention and Perception: Attention and perception normal.        Mood and Affect: Mood and affect normal.        Speech: Speech normal.        Behavior: Behavior normal. Behavior is cooperative.        Thought Content: Thought content normal.        Cognition and Memory: Cognition and memory normal.        Judgment: Judgment normal.     Results for orders placed or performed in visit on 12/27/17  COMPLETE METABOLIC PANEL WITH GFR  Result Value Ref Range   Glucose, Bld 97 65 - 99 mg/dL   BUN 12 7 - 25 mg/dL   Creat 6.72 0.94 - 7.09 mg/dL   GFR, Est Non African American 82 > OR = 60 mL/min/1.36m2   GFR, Est African American 95 > OR = 60 mL/min/1.66m2   BUN/Creatinine Ratio NOT APPLICABLE 6 - 22 (calc)   Sodium 140 135 - 146 mmol/L   Potassium 4.7 3.5 - 5.3 mmol/L   Chloride 104 98 - 110 mmol/L   CO2 30  20 - 32 mmol/L   Calcium 9.5 8.6 - 10.4 mg/dL   Total Protein 6.7 6.1 - 8.1 g/dL   Albumin 4.6 3.6 - 5.1 g/dL   Globulin 2.1 1.9 - 3.7 g/dL (calc)   AG Ratio 2.2 1.0 - 2.5 (calc)   Total Bilirubin 0.6 0.2 - 1.2 mg/dL   Alkaline phosphatase (APISO) 57 33 - 130 U/L   AST 27 10 - 35 U/L   ALT 33 (H) 6 - 29 U/L  Lipid panel  Result Value Ref Range   Cholesterol 166 <200 mg/dL   HDL 33 (L) >82>50 mg/dL   Triglycerides 956264 (H) <150 mg/dL   LDL Cholesterol (Calc) 95 mg/dL (calc)   Total CHOL/HDL Ratio 5.0 (H) <5.0 (calc)   Non-HDL Cholesterol (Calc) 133 (H) <130 mg/dL (calc)  TSH  Result Value Ref Range   TSH 3.36 0.40 - 4.50 mIU/L  CBC with Differential/Platelet  Result Value Ref Range   WBC 6.6 3.8 - 10.8 Thousand/uL   RBC 4.53 3.80 - 5.10 Million/uL   Hemoglobin 14.9 11.7 - 15.5 g/dL   HCT 21.344.3 35 - 45 %   MCV 97.8 80.0 - 100.0 fL   MCH 32.9 27.0 - 33.0 pg   MCHC  33.6 32.0 - 36.0 g/dL   RDW 08.612.4 57.811.0 - 46.915.0 %   Platelets 136 (L) 140 - 400 Thousand/uL   MPV 11.8 7.5 - 12.5 fL   Neutro Abs 3,307 1,500 - 7,800 cells/uL   Lymphs Abs 2,482 850 - 3,900 cells/uL   WBC mixed population 561 200 - 950 cells/uL   Eosinophils Absolute 231 15 - 500 cells/uL   Basophils Absolute 20 0 - 200 cells/uL   Neutrophils Relative % 50.1 %   Total Lymphocyte 37.6 %   Monocytes Relative 8.5 %   Eosinophils Relative 3.5 %   Basophils Relative 0.3 %      Assessment & Plan:   Problem List Items Addressed This Visit      Digestive   Hepatic steatosis   Relevant Orders   COMPLETE METABOLIC PANEL WITH GFR     Other   Thrombocytopenia (HCC)   Relevant Orders   CBC with Differential   Routine medical exam - Primary    Annual physical exam without new findings.  Well adult with no acute concerns.  Plan: 1. Obtain health maintenance screenings as above according to age. - Increase physical activity to 30 minutes most days of the week.  - Eat healthy diet high in vegetables and fruits; low in refined carbohydrates. - Screening labs and tests as ordered 2. Return 1 year for annual physical.        Other Visit Diagnoses    Long-term use of high-risk medication       Relevant Orders   CBC with Differential   COMPLETE METABOLIC PANEL WITH GFR   Lipid Profile   Thyroid Panel With TSH   Weight gain       Relevant Orders   Thyroid Panel With TSH   Mixed hyperlipidemia       Relevant Orders   Lipid Profile   Elevated liver function tests       Relevant Orders   COMPLETE METABOLIC PANEL WITH GFR   Abnormal urine       Relevant Orders   POCT Urinalysis Dipstick      No orders of the defined types were placed in this encounter.     Follow up plan: Return in about 1 year (around  03/30/2021) for CPE.  Charlaine Dalton, FNP-C Family Nurse Practitioner [facility] 03/30/2020, 9:22 AM

## 2020-04-01 LAB — COMPLETE METABOLIC PANEL WITH GFR
AG Ratio: 1.8 (calc) (ref 1.0–2.5)
ALT: 23 U/L (ref 6–29)
AST: 18 U/L (ref 10–35)
Albumin: 4.4 g/dL (ref 3.6–5.1)
Alkaline phosphatase (APISO): 52 U/L (ref 37–153)
BUN: 18 mg/dL (ref 7–25)
CO2: 27 mmol/L (ref 20–32)
Calcium: 9.6 mg/dL (ref 8.6–10.4)
Chloride: 106 mmol/L (ref 98–110)
Creat: 0.81 mg/dL (ref 0.60–0.93)
GFR, Est African American: 81 mL/min/{1.73_m2} (ref >=60)
GFR, Est Non African American: 70 mL/min/{1.73_m2} (ref >=60)
Globulin: 2.4 g/dL (calc) (ref 1.9–3.7)
Glucose, Bld: 102 mg/dL — ABNORMAL HIGH (ref 65–99)
Potassium: 4.7 mmol/L (ref 3.5–5.3)
Sodium: 141 mmol/L (ref 135–146)
Total Bilirubin: 0.6 mg/dL (ref 0.2–1.2)
Total Protein: 6.8 g/dL (ref 6.1–8.1)

## 2020-04-01 LAB — THYROID PANEL WITH TSH
Free Thyroxine Index: 2 (ref 1.4–3.8)
T3 Uptake: 28 % (ref 22–35)
T4, Total: 7.2 ug/dL (ref 5.1–11.9)
TSH: 2.54 mIU/L (ref 0.40–4.50)

## 2020-04-01 LAB — CBC WITH DIFFERENTIAL/PLATELET
Absolute Monocytes: 614 cells/uL (ref 200–950)
Basophils Absolute: 28 cells/uL (ref 0–200)
Basophils Relative: 0.4 %
Eosinophils Absolute: 262 cells/uL (ref 15–500)
Eosinophils Relative: 3.8 %
HCT: 45.6 % — ABNORMAL HIGH (ref 35.0–45.0)
Hemoglobin: 15 g/dL (ref 11.7–15.5)
Lymphs Abs: 2153 cells/uL (ref 850–3900)
MCH: 33 pg (ref 27.0–33.0)
MCHC: 32.9 g/dL (ref 32.0–36.0)
MCV: 100.4 fL — ABNORMAL HIGH (ref 80.0–100.0)
MPV: 12.2 fL (ref 7.5–12.5)
Monocytes Relative: 8.9 %
Neutro Abs: 3843 cells/uL (ref 1500–7800)
Neutrophils Relative %: 55.7 %
Platelets: 137 10*3/uL — ABNORMAL LOW (ref 140–400)
RBC: 4.54 10*6/uL (ref 3.80–5.10)
RDW: 12.7 % (ref 11.0–15.0)
Total Lymphocyte: 31.2 %
WBC: 6.9 10*3/uL (ref 3.8–10.8)

## 2020-04-01 LAB — LIPID PANEL
Cholesterol: 158 mg/dL (ref <200)
HDL: 37 mg/dL — ABNORMAL LOW (ref >=50)
LDL Cholesterol (Calc): 87 mg/dL (calc)
Non-HDL Cholesterol (Calc): 121 mg/dL (calc) (ref <130)
Total CHOL/HDL Ratio: 4.3 (calc) (ref <5.0)
Triglycerides: 258 mg/dL — ABNORMAL HIGH (ref <150)

## 2020-04-01 LAB — HEMOGLOBIN A1C W/OUT EAG: Hgb A1c MFr Bld: 5.1 % of total Hgb (ref <5.7)

## 2020-04-03 ENCOUNTER — Other Ambulatory Visit: Payer: Self-pay

## 2020-04-03 ENCOUNTER — Ambulatory Visit
Admission: RE | Admit: 2020-04-03 | Discharge: 2020-04-03 | Disposition: A | Discharge status: Home / Self Care | Payer: Medicare PPO | Source: Ambulatory Visit | Attending: Nurse Practitioner | Admitting: Nurse Practitioner

## 2020-04-03 DIAGNOSIS — Z87891 Personal history of nicotine dependence: Secondary | ICD-10-CM | POA: Insufficient documentation

## 2020-04-03 DIAGNOSIS — F17210 Nicotine dependence, cigarettes, uncomplicated: Secondary | ICD-10-CM | POA: Diagnosis not present

## 2020-04-03 DIAGNOSIS — Z122 Encounter for screening for malignant neoplasm of respiratory organs: Secondary | ICD-10-CM | POA: Insufficient documentation

## 2020-04-08 ENCOUNTER — Encounter: Payer: Self-pay | Admitting: *Deleted

## 2020-04-27 DIAGNOSIS — H2512 Age-related nuclear cataract, left eye: Secondary | ICD-10-CM | POA: Diagnosis not present

## 2020-05-19 DIAGNOSIS — Z833 Family history of diabetes mellitus: Secondary | ICD-10-CM | POA: Diagnosis not present

## 2020-05-19 DIAGNOSIS — R030 Elevated blood-pressure reading, without diagnosis of hypertension: Secondary | ICD-10-CM | POA: Diagnosis not present

## 2020-05-19 DIAGNOSIS — R32 Unspecified urinary incontinence: Secondary | ICD-10-CM | POA: Diagnosis not present

## 2020-05-19 DIAGNOSIS — I951 Orthostatic hypotension: Secondary | ICD-10-CM | POA: Diagnosis not present

## 2020-05-19 DIAGNOSIS — Z720 Tobacco use: Secondary | ICD-10-CM | POA: Diagnosis not present

## 2020-05-19 DIAGNOSIS — H547 Unspecified visual loss: Secondary | ICD-10-CM | POA: Diagnosis not present

## 2020-05-19 DIAGNOSIS — Z9181 History of falling: Secondary | ICD-10-CM | POA: Diagnosis not present

## 2020-05-19 DIAGNOSIS — Z809 Family history of malignant neoplasm, unspecified: Secondary | ICD-10-CM | POA: Diagnosis not present

## 2020-05-19 DIAGNOSIS — J449 Chronic obstructive pulmonary disease, unspecified: Secondary | ICD-10-CM | POA: Diagnosis not present

## 2020-06-02 ENCOUNTER — Ambulatory Visit: Payer: Medicare PPO | Admitting: Podiatry

## 2020-07-14 DIAGNOSIS — H2512 Age-related nuclear cataract, left eye: Secondary | ICD-10-CM | POA: Diagnosis not present

## 2020-07-14 DIAGNOSIS — H40003 Preglaucoma, unspecified, bilateral: Secondary | ICD-10-CM | POA: Diagnosis not present

## 2020-08-06 ENCOUNTER — Ambulatory Visit: Admit: 2020-08-06 | Payer: Medicare PPO | Admitting: Ophthalmology

## 2020-08-06 SURGERY — PHACOEMULSIFICATION, CATARACT, WITH IOL INSERTION
Anesthesia: Topical | Laterality: Left

## 2020-08-21 DIAGNOSIS — H2512 Age-related nuclear cataract, left eye: Secondary | ICD-10-CM | POA: Diagnosis not present

## 2020-08-21 DIAGNOSIS — J449 Chronic obstructive pulmonary disease, unspecified: Secondary | ICD-10-CM | POA: Diagnosis not present

## 2020-09-01 ENCOUNTER — Encounter: Payer: Self-pay | Admitting: Ophthalmology

## 2020-09-07 ENCOUNTER — Other Ambulatory Visit
Admission: RE | Admit: 2020-09-07 | Discharge: 2020-09-07 | Disposition: A | Discharge status: Home / Self Care | Payer: Medicare PPO | Source: Ambulatory Visit | Attending: Ophthalmology | Admitting: Ophthalmology

## 2020-09-07 DIAGNOSIS — Z20822 Contact with and (suspected) exposure to covid-19: Secondary | ICD-10-CM | POA: Insufficient documentation

## 2020-09-07 DIAGNOSIS — Z01812 Encounter for preprocedural laboratory examination: Secondary | ICD-10-CM | POA: Diagnosis not present

## 2020-09-07 LAB — SARS CORONAVIRUS 2 (TAT 6-24 HRS): SARS Coronavirus 2: NEGATIVE

## 2020-09-07 NOTE — Discharge Instructions (Signed)

## 2020-09-09 ENCOUNTER — Encounter: Payer: Self-pay | Admitting: Ophthalmology

## 2020-09-09 ENCOUNTER — Encounter
Admission: RE | Disposition: A | Discharge status: Home / Self Care | Payer: Self-pay | Source: Home / Self Care | Attending: Ophthalmology

## 2020-09-09 ENCOUNTER — Ambulatory Visit: Payer: Medicare PPO | Admitting: Anesthesiology

## 2020-09-09 ENCOUNTER — Other Ambulatory Visit: Payer: Self-pay

## 2020-09-09 ENCOUNTER — Ambulatory Visit
Admission: RE | Admit: 2020-09-09 | Discharge: 2020-09-09 | Disposition: A | Discharge status: Home / Self Care | Payer: Medicare PPO | Attending: Ophthalmology | Admitting: Ophthalmology

## 2020-09-09 DIAGNOSIS — F17200 Nicotine dependence, unspecified, uncomplicated: Secondary | ICD-10-CM | POA: Diagnosis not present

## 2020-09-09 DIAGNOSIS — H2512 Age-related nuclear cataract, left eye: Secondary | ICD-10-CM | POA: Insufficient documentation

## 2020-09-09 DIAGNOSIS — Z885 Allergy status to narcotic agent status: Secondary | ICD-10-CM | POA: Diagnosis not present

## 2020-09-09 DIAGNOSIS — H25812 Combined forms of age-related cataract, left eye: Secondary | ICD-10-CM | POA: Diagnosis not present

## 2020-09-09 HISTORY — DX: Heart failure, unspecified: I50.9

## 2020-09-09 HISTORY — DX: Nausea with vomiting, unspecified: R11.2

## 2020-09-09 HISTORY — PX: CATARACT EXTRACTION W/PHACO: SHX586

## 2020-09-09 HISTORY — DX: Chronic obstructive pulmonary disease, unspecified: J44.9

## 2020-09-09 HISTORY — DX: Other specified postprocedural states: Z98.890

## 2020-09-09 SURGERY — PHACOEMULSIFICATION, CATARACT, WITH IOL INSERTION
Anesthesia: Monitor Anesthesia Care | Site: Eye | Laterality: Left

## 2020-09-09 MED ORDER — ACETAMINOPHEN 160 MG/5ML PO SOLN: 325.0000 mg | Freq: Once | ORAL | Status: DC

## 2020-09-09 MED ORDER — BRIMONIDINE TARTRATE-TIMOLOL 0.2-0.5 % OP SOLN
OPHTHALMIC | Status: DC | PRN
  Administered 2020-09-09: 1 [drp] via OPHTHALMIC

## 2020-09-09 MED ORDER — ACETAMINOPHEN 325 MG PO TABS: 325.0000 mg | ORAL_TABLET | Freq: Once | ORAL | Status: DC

## 2020-09-09 MED ORDER — CEFUROXIME OPHTHALMIC INJECTION 1 MG/0.1 ML
INJECTION | OPHTHALMIC | Status: DC | PRN
  Administered 2020-09-09: 0.1 mL via INTRACAMERAL

## 2020-09-09 MED ORDER — TETRACAINE HCL 0.5 % OP SOLN
1.0000 [drp] | OPHTHALMIC | Status: DC | PRN
  Administered 2020-09-09 (×3): 1 [drp] via OPHTHALMIC

## 2020-09-09 MED ORDER — FENTANYL CITRATE (PF) 100 MCG/2ML IJ SOLN
INTRAMUSCULAR | Status: DC | PRN
  Administered 2020-09-09: 50 ug via INTRAVENOUS

## 2020-09-09 MED ORDER — EPINEPHRINE PF 1 MG/ML IJ SOLN
INTRAOCULAR | Status: DC | PRN
  Administered 2020-09-09: 09:00:00 65 mL via OPHTHALMIC

## 2020-09-09 MED ORDER — NA HYALUR & NA CHOND-NA HYALUR 0.4-0.35 ML IO KIT
PACK | INTRAOCULAR | Status: DC | PRN
  Administered 2020-09-09: 1 mL via INTRAOCULAR

## 2020-09-09 MED ORDER — ARMC OPHTHALMIC DILATING DROPS
1.0000 "application " | OPHTHALMIC | Status: DC | PRN
  Administered 2020-09-09 (×3): 1 via OPHTHALMIC

## 2020-09-09 MED ORDER — MIDAZOLAM HCL 2 MG/2ML IJ SOLN
INTRAMUSCULAR | Status: DC | PRN
  Administered 2020-09-09: 1 mg via INTRAVENOUS

## 2020-09-09 MED ORDER — LACTATED RINGERS IV SOLN: INTRAVENOUS | Status: DC

## 2020-09-09 MED ORDER — LIDOCAINE HCL (PF) 2 % IJ SOLN
INTRAOCULAR | Status: DC | PRN
  Administered 2020-09-09: 1 mL

## 2020-09-09 SURGICAL SUPPLY — 23 items
CANNULA ANT/CHMB 27G (MISCELLANEOUS) ×1 IMPLANT
CANNULA ANT/CHMB 27GA (MISCELLANEOUS) ×3 IMPLANT
GLOVE SURG LX 7.5 STRW (GLOVE) ×2
GLOVE SURG LX STRL 7.5 STRW (GLOVE) ×1 IMPLANT
GLOVE SURG TRIUMPH 8.0 PF LTX (GLOVE) ×3 IMPLANT
GOWN STRL REUS W/ TWL LRG LVL3 (GOWN DISPOSABLE) ×2 IMPLANT
GOWN STRL REUS W/TWL LRG LVL3 (GOWN DISPOSABLE) ×6
LENS IOL DIOP 22.0 (Intraocular Lens) ×3 IMPLANT
LENS IOL TECNIS MONO 22.0 (Intraocular Lens) IMPLANT
MARKER SKIN DUAL TIP RULER LAB (MISCELLANEOUS) ×3 IMPLANT
NDL CAPSULORHEX 25GA (NEEDLE) ×1 IMPLANT
NDL FILTER BLUNT 18X1 1/2 (NEEDLE) ×2 IMPLANT
NEEDLE CAPSULORHEX 25GA (NEEDLE) ×3 IMPLANT
NEEDLE FILTER BLUNT 18X 1/2SAF (NEEDLE) ×4
NEEDLE FILTER BLUNT 18X1 1/2 (NEEDLE) ×2 IMPLANT
PACK CATARACT BRASINGTON (MISCELLANEOUS) ×3 IMPLANT
PACK EYE AFTER SURG (MISCELLANEOUS) ×3 IMPLANT
PACK OPTHALMIC (MISCELLANEOUS) ×3 IMPLANT
SOLUTION OPHTHALMIC SALT (MISCELLANEOUS) ×3 IMPLANT
SYR 3ML LL SCALE MARK (SYRINGE) ×6 IMPLANT
SYR TB 1ML LUER SLIP (SYRINGE) ×3 IMPLANT
WATER STERILE IRR 250ML POUR (IV SOLUTION) ×3 IMPLANT
WIPE NON LINTING 3.25X3.25 (MISCELLANEOUS) ×3 IMPLANT

## 2020-09-09 NOTE — Transfer of Care (Signed)
Immediate Anesthesia Transfer of Care Note  Patient: Kristine Le  Procedure(s) Performed: CATARACT EXTRACTION PHACO AND INTRAOCULAR LENS PLACEMENT (IOC) LEFT 8.52 01:05.1 13.1% (Left Eye)  Patient Location: PACU  Anesthesia Type: MAC  Level of Consciousness: awake, alert  and patient cooperative  Airway and Oxygen Therapy: Patient Spontanous Breathing and Patient connected to supplemental oxygen  Post-op Assessment: Post-op Vital signs reviewed, Patient's Cardiovascular Status Stable, Respiratory Function Stable, Patent Airway and No signs of Nausea or vomiting  Post-op Vital Signs: Reviewed and stable  Complications: No complications documented.

## 2020-09-09 NOTE — Op Note (Signed)
OPERATIVE NOTE  [name] [id number] 09/09/2020   PREOPERATIVE DIAGNOSIS:  Nuclear sclerotic cataract left eye. H25.12   POSTOPERATIVE DIAGNOSIS:    Nuclear sclerotic cataract left eye.     PROCEDURE:  Phacoemusification with posterior chamber intraocular lens placement of the left eye  Ultrasound time: Procedure(s): CATARACT EXTRACTION PHACO AND INTRAOCULAR LENS PLACEMENT (IOC) LEFT 8.52 01:05.1 13.1% (Left)  LENS:   Implant Name Type Inv. Item Serial No. Manufacturer Lot No. LRB No. Used Action  LENS IOL DIOP 22.0 - [id number] Intraocular Lens LENS IOL DIOP 22.0 [id number] [id number]   Left 1 Implanted      SURGEON:  Deirdre Evener, MD   ANESTHESIA:  Topical with tetracaine drops and 2% Xylocaine jelly, augmented with 1% preservative-free intracameral lidocaine.    COMPLICATIONS:  None.   DESCRIPTION OF PROCEDURE:  The patient was identified in the holding room and transported to the operating room and placed in the supine position under the operating microscope.  The left eye was identified as the operative eye and it was prepped and draped in the usual sterile ophthalmic fashion.   A 1 millimeter clear-corneal paracentesis was made at the 1:30 position.  0.5 ml of preservative-free 1% lidocaine was injected into the anterior chamber.  The anterior chamber was filled with Viscoat viscoelastic.  A 2.4 millimeter keratome was used to make a near-clear corneal incision at the 10:30 position.  .  A curvilinear capsulorrhexis was made with a cystotome and capsulorrhexis forceps.  Balanced salt solution was used to hydrodissect and hydrodelineate the nucleus.   Phacoemulsification was then used in stop and chop fashion to remove the lens nucleus and epinucleus.  The remaining cortex was then removed using the irrigation and aspiration handpiece. Provisc was then placed into the capsular bag to distend it for lens placement.  A lens was then injected into the capsular bag.  The  remaining viscoelastic was aspirated.   Wounds were hydrated with balanced salt solution.  The anterior chamber was inflated to a physiologic pressure with balanced salt solution.  No wound leaks were noted. Cefuroxime 0.1 ml of a 10mg /ml solution was injected into the anterior chamber for a dose of 1 mg of intracameral antibiotic at the completion of the case.   Timolol and Brimonidine drops were applied to the eye.  The patient was taken to the recovery room in stable condition without complications of anesthesia or surgery.  Atziri Zubiate 09/09/2020, 9:06 AM

## 2020-09-09 NOTE — H&P (Signed)
°  The History and Physical notes are on paper, have been signed, and are to be scanned. The patient remains stable and unchanged from the H&P.   Previous H&P reviewed, patient examined, and there are no changes.  The patient has a visually significant cataract interfering with his or her vision.  I attest that the following are true and accurate to the best of my knowledge: 1. The patient's impairment of visual function is believed not to be correctable with a tolerable change in glasses or contact lenses. 2. Cataract (in the operative eye) is believed to be significantly contributing to the patient's visual impairment. 3. The patient desires surgical correction; the risks, benefits, and alternatives have been explained, and questions have been answered to the patients satisfaction.  A reasonable expectation exists that cataract surgery will significantly improve both the visual and functional status of the patient.  Kristine Le 09/09/2020 8:17 AM

## 2020-09-09 NOTE — Anesthesia Postprocedure Evaluation (Signed)
Anesthesia Post Note  Patient: Kristine Le  Procedure(s) Performed: CATARACT EXTRACTION PHACO AND INTRAOCULAR LENS PLACEMENT (IOC) LEFT 8.52 01:05.1 13.1% (Left Eye)     Patient location during evaluation: PACU Anesthesia Type: MAC Level of consciousness: awake and alert and oriented Pain management: satisfactory to patient Vital Signs Assessment: post-procedure vital signs reviewed and stable Respiratory status: spontaneous breathing, nonlabored ventilation and respiratory function stable Cardiovascular status: blood pressure returned to baseline and stable Postop Assessment: Adequate PO intake and No signs of nausea or vomiting Anesthetic complications: no   No complications documented.  Raliegh Ip

## 2020-09-09 NOTE — Anesthesia Procedure Notes (Signed)
Procedure Name: MAC Date/Time: 09/09/2020 8:43 AM Performed by: Cameron Ali, CRNA Pre-anesthesia Checklist: Patient identified, Emergency Drugs available, Suction available, Timeout performed and Patient being monitored Patient Re-evaluated:Patient Re-evaluated prior to induction Oxygen Delivery Method: Nasal cannula Placement Confirmation: positive ETCO2

## 2020-09-09 NOTE — Anesthesia Preprocedure Evaluation (Signed)
Anesthesia Evaluation  Patient identified by MRN, date of birth, ID band Patient awake    Reviewed: Allergy & Precautions, H&P , NPO status , Patient's Chart, lab work & pertinent test results  Airway Mallampati: II  TM Distance: >3 FB Neck ROM: full    Dental no notable dental hx. (+) Partial Upper, Partial Lower   Pulmonary COPD, Current Smoker and Patient abstained from smoking.,    Pulmonary exam normal breath sounds clear to auscultation       Cardiovascular Normal cardiovascular exam Rhythm:regular Rate:Normal     Neuro/Psych    GI/Hepatic   Endo/Other    Renal/GU      Musculoskeletal   Abdominal   Peds  Hematology   Anesthesia Other Findings   Reproductive/Obstetrics                             Anesthesia Physical Anesthesia Plan  ASA: II  Anesthesia Plan: MAC   Post-op Pain Management:    Induction:   PONV Risk Score and Plan: 2 and Treatment may vary due to age or medical condition, TIVA and Midazolam  Airway Management Planned:   Additional Equipment:   Intra-op Plan:   Post-operative Plan:   Informed Consent: I have reviewed the patients History and Physical, chart, labs and discussed the procedure including the risks, benefits and alternatives for the proposed anesthesia with the patient or authorized representative who has indicated his/her understanding and acceptance.     Dental Advisory Given  Plan Discussed with: CRNA  Anesthesia Plan Comments:         Anesthesia Quick Evaluation

## 2021-01-28 ENCOUNTER — Telehealth (INDEPENDENT_AMBULATORY_CARE_PROVIDER_SITE_OTHER): Payer: Medicare PPO | Admitting: Internal Medicine

## 2021-01-28 ENCOUNTER — Encounter: Payer: Self-pay | Admitting: Internal Medicine

## 2021-01-28 ENCOUNTER — Other Ambulatory Visit: Payer: Self-pay

## 2021-01-28 DIAGNOSIS — J069 Acute upper respiratory infection, unspecified: Secondary | ICD-10-CM

## 2021-01-28 DIAGNOSIS — J410 Simple chronic bronchitis: Secondary | ICD-10-CM | POA: Diagnosis not present

## 2021-01-28 DIAGNOSIS — F17200 Nicotine dependence, unspecified, uncomplicated: Secondary | ICD-10-CM

## 2021-01-28 NOTE — Patient Instructions (Signed)

## 2021-01-28 NOTE — Progress Notes (Addendum)
Virtual Visit via Telephone Note  I connected with Kristine Le on 01/28/21 at  8:40 AM EDT by telephone and verified that I am speaking with the correct person using two identifiers.  Location: Patient: Home Provider: Office  Persons participating in this telephone call: Ovidio Hanger, NP and Kristine Le   I discussed the limitations, risks, security and privacy concerns of performing an evaluation and management service by telephone and the availability of in person appointments. I also discussed with the patient that there may be a patient responsible charge related to this service. The patient expressed understanding and agreed to proceed.   History of Present Illness:  Patient reports headache, runny nose, nasal congestion, scratchy throat and cough.  She reports this started 2 days ago.  The headache is located in her forehead.  She describes the pain as pressure.  She is blowing clear mucus out of her nose.  She denies difficulty swallowing.  The cough is productive of clear mucus.  She denies dizziness, visual changes, ear pain or shortness of breath.  She has felt like she has run a fever but has not actually taken her temperature.  She reports chills but denies body aches.  She had 1 episode of nausea and vomiting yesterday.  She denies diarrhea.  She has taken Zyrtec and NyQuil OTC with minimal relief of symptoms.  She has not had sick contacts or exposure to COVID that she is aware of.  She has not gotten her COVID vaccines.  She is a smoker and has COPD.    Past Medical History:  Diagnosis Date  . Allergy   . CHF (congestive heart failure) (HCC)   . Colon polyps   . COPD (chronic obstructive pulmonary disease) (HCC)   . PONV (postoperative nausea and vomiting)     Current Outpatient Medications  Medication Sig Dispense Refill  . Ascorbic Acid (VITAMIN C) 1000 MG tablet Take 1,000 mg by mouth daily.    . B Complex-C-E-Zn (B COMPLEX-C-E-ZINC) tablet Take 1 tablet by mouth  daily.    . cetirizine (ZYRTEC) 10 MG tablet Take 10 mg by mouth daily.    . Cholecalciferol (VITAMIN D3) 1000 units CAPS Take by mouth.    . Collagen 500 MG CAPS Take by mouth.    Marland Kitchen ibuprofen (ADVIL,MOTRIN) 200 MG tablet Take by mouth.    . Zinc Sulfate (ZINC 15 PO) Take by mouth.     No current facility-administered medications for this visit.    Allergies  Allergen Reactions  . Codeine Nausea And Vomiting    Pt does not want to take any additional codeine products.    Family History  Problem Relation Age of Onset  . Breast cancer Cousin        maternal  . Kidney cancer Father   . Asthma Mother   . Arthritis Sister   . GER disease Sister   . Diabetes Sister   . Bone cancer Son   . Kidney cancer Son     Social History   Socioeconomic History  . Marital status: Widowed    Spouse name: Not on file  . Number of children: Not on file  . Years of education: Not on file  . Highest education level: High school graduate  Occupational History  . Occupation: retired  Tobacco Use  . Smoking status: Current Every Day Smoker    Packs/day: 1.00    Years: 53.00    Pack years: 53.00    Types: Cigarettes  .  Smokeless tobacco: Never Used  . Tobacco comment: she quit for 8 years in 1980s and would like to quit.  Meds not effective in past.  Vaping Use  . Vaping Use: Never used  Substance and Sexual Activity  . Alcohol use: Yes    Alcohol/week: 2.0 standard drinks    Types: 2 Cans of beer per week    Comment: occasionally  . Drug use: No  . Sexual activity: Not on file  Other Topics Concern  . Not on file  Social History Narrative  . Not on file   Social Determinants of Health   Financial Resource Strain: Not on file  Food Insecurity: Not on file  Transportation Needs: Not on file  Physical Activity: Not on file  Stress: Not on file  Social Connections: Not on file  Intimate Partner Violence: Not on file     Constitutional: Patient reports headache, chills.   Denies malaise, fatigue, or abrupt weight changes.  HEENT: Patient reports runny nose, nasal congestion and scratchy throat.  Denies eye pain, eye redness, ear pain, ringing in the ears, wax buildup, bloody nose, or sore throat. Respiratory: Patient reports cough.  Denies difficulty breathing, shortness of breath, or sputum production.   Cardiovascular: Denies chest pain, chest tightness, palpitations or swelling in the hands or feet.  Gastrointestinal: Patient reports nausea and vomiting.  Denies abdominal pain, bloating, constipation, diarrhea or blood in the stool.  Neurological: Denies dizziness, difficulty with memory, difficulty with speech or problems with balance and coordination.   No other specific complaints in a complete review of systems (except as listed in HPI above).  Observations/Objective:  There were no vitals taken for this visit. Wt Readings from Last 3 Encounters:  09/09/20 171 lb (77.6 kg)  04/03/20 169 lb (76.7 kg)  03/30/20 169 lb (76.7 kg)    General: In NAD. HEENT:  Nose: No congestion noted.; Throat/Mouth: Hoarseness noted. Pulmonary/Chest: Normal effort.  Dry cough noted. No respiratory distress.  Neurological: Alert and oriented.   BMET    Component Value Date/Time   NA 141 03/30/2020 0850   NA 140 01/09/2015 1123   K 4.7 03/30/2020 0850   K 3.9 01/09/2015 1123   CL 106 03/30/2020 0850   CL 105 01/09/2015 1123   CO2 27 03/30/2020 0850   CO2 28 01/09/2015 1123   GLUCOSE 102 (H) 03/30/2020 0850   GLUCOSE 100 (H) 01/09/2015 1123   BUN 18 03/30/2020 0850   BUN 12 01/09/2015 1123   CREATININE 0.81 03/30/2020 0850   CALCIUM 9.6 03/30/2020 0850   CALCIUM 9.4 01/09/2015 1123   GFRNONAA 70 03/30/2020 0850   GFRAA 81 03/30/2020 0850    Lipid Panel     Component Value Date/Time   CHOL 158 03/30/2020 0850   TRIG 258 (H) 03/30/2020 0850   HDL 37 (L) 03/30/2020 0850   CHOLHDL 4.3 03/30/2020 0850   LDLCALC 87 03/30/2020 0850    CBC    Component  Value Date/Time   WBC 6.9 03/30/2020 0850   RBC 4.54 03/30/2020 0850   HGB 15.0 03/30/2020 0850   HGB 14.7 01/09/2015 1123   HCT 45.6 (H) 03/30/2020 0850   HCT 43.0 01/09/2015 1123   PLT 137 (L) 03/30/2020 0850   PLT 128 (L) 01/09/2015 1123   MCV 100.4 (H) 03/30/2020 0850   MCV 97 01/09/2015 1123   MCH 33.0 03/30/2020 0850   MCHC 32.9 03/30/2020 0850   RDW 12.7 03/30/2020 0850   RDW 12.4 01/09/2015 1123  LYMPHSABS 2,153 03/30/2020 0850   LYMPHSABS 2.2 01/09/2015 1123   MONOABS 0.5 01/09/2015 1123   EOSABS 262 03/30/2020 0850   EOSABS 0.3 01/09/2015 1123   BASOSABS 28 03/30/2020 0850   BASOSABS 0.0 01/09/2015 1123   BASOSABS 1 01/09/2015 1123    Hgb A1C Lab Results  Component Value Date   HGBA1C 5.1 03/30/2020       Assessment and Plan:  Viral URI with Cough, Smoker, COPD:  Could be allergy induced versus COVID Advised her if symptoms get worse would recommend a COVID test Continue Zyrtec 10 mg OTC daily Add Nasacort 1 spray twice daily for 3 days then daily thereafter No indication for antibiotics or steroids at this time  Return precautions discussed  Follow Up Instructions:    I discussed the assessment and treatment plan with the patient. The patient was provided an opportunity to ask questions and all were answered. The patient agreed with the plan and demonstrated an understanding of the instructions.   The patient was advised to call back or seek an in-person evaluation if the symptoms worsen or if the condition fails to improve as anticipated.  I provided 5:05 minutes of non-face-to-face time during this encounter.   Nicki Reaper, NP

## 2021-02-02 ENCOUNTER — Ambulatory Visit (INDEPENDENT_AMBULATORY_CARE_PROVIDER_SITE_OTHER): Payer: Medicare PPO

## 2021-02-02 ENCOUNTER — Telehealth: Payer: Medicare PPO | Admitting: Internal Medicine

## 2021-02-02 VITALS — Ht 68.0 in | Wt 170.0 lb

## 2021-02-02 DIAGNOSIS — Z0000 Encounter for general adult medical examination without abnormal findings: Secondary | ICD-10-CM | POA: Diagnosis not present

## 2021-02-02 NOTE — Progress Notes (Signed)
I connected with [name] today by telephone and verified that I am speaking with the correct person using two identifiers. Location patient: home Location provider: work Persons participating in the virtual visit: Aleira Deiter, Elisha Ponder LPN.   I discussed the limitations, risks, security and privacy concerns of performing an evaluation and management service by telephone and the availability of in person appointments. I also discussed with the patient that there may be a patient responsible charge related to this service. The patient expressed understanding and verbally consented to this telephonic visit.    Interactive audio and video telecommunications were attempted between this provider and patient, however failed, due to patient having technical difficulties OR patient did not have access to video capability.  We continued and completed visit with audio only.     Vital signs may be patient reported or missing.  Subjective:   Tahj Lindseth is a 80 y.o. female who presents for Medicare Annual (Subsequent) preventive examination.  Review of Systems     Cardiac Risk Factors include: advanced age (>36men, >57 women);sedentary lifestyle;smoking/ tobacco exposure     Objective:    Today's Vitals   02/02/21 0937  Weight: 170 lb (77.1 kg)  Height: 5\' 8"  (1.727 m)   Body mass index is 25.85 kg/m.  Advanced Directives 02/02/2021 09/09/2020 01/22/2019 10/10/2017  Does Patient Have a Medical Advance Directive? No No No No  Would patient like information on creating a medical advance directive? - No - Patient declined Yes (MAU/Ambulatory/Procedural Areas - Information given) Yes (MAU/Ambulatory/Procedural Areas - Information given)    Current Medications (verified) Outpatient Encounter Medications as of 02/02/2021  Medication Sig  . Ascorbic Acid (VITAMIN C) 1000 MG tablet Take 1,000 mg by mouth daily.  . B Complex-C-E-Zn (B COMPLEX-C-E-ZINC) tablet Take 1 tablet by mouth daily.   . cetirizine (ZYRTEC) 10 MG tablet Take 10 mg by mouth daily.  . Cholecalciferol (VITAMIN D3) 1000 units CAPS Take by mouth.  . Collagen 500 MG CAPS Take by mouth.  02/04/2021 ibuprofen (ADVIL,MOTRIN) 200 MG tablet Take by mouth.  . Zinc Sulfate (ZINC 15 PO) Take by mouth.   No facility-administered encounter medications on file as of 02/02/2021.    Allergies (verified) Codeine   History: Past Medical History:  Diagnosis Date  . Allergy   . CHF (congestive heart failure) (HCC)   . Colon polyps   . COPD (chronic obstructive pulmonary disease) (HCC)   . PONV (postoperative nausea and vomiting)    Past Surgical History:  Procedure Laterality Date  . CATARACT EXTRACTION W/PHACO Left 09/09/2020   Procedure: CATARACT EXTRACTION PHACO AND INTRAOCULAR LENS PLACEMENT (IOC) LEFT 8.52 01:05.1 13.1%;  Surgeon: 09/11/2020, MD;  Location: [facility];  Service: Ophthalmology;  Laterality: Left;  . KNEE SURGERY    . PARTIAL HYSTERECTOMY    . THUMB ARTHROSCOPY     Family History  Problem Relation Age of Onset  . Breast cancer Cousin        maternal  . Kidney cancer Father   . Asthma Mother   . Arthritis Sister   . GER disease Sister   . Diabetes Sister   . Bone cancer Son   . Kidney cancer Son    Social History   Socioeconomic History  . Marital status: Widowed    Spouse name: Not on file  . Number of children: Not on file  . Years of education: Not on file  . Highest education level: High school graduate  Occupational History  .  Occupation: retired  Tobacco Use  . Smoking status: Current Every Day Smoker    Packs/day: 1.00    Years: 53.00    Pack years: 53.00    Types: Cigarettes  . Smokeless tobacco: Never Used  . Tobacco comment: she quit for 8 years in 1980s and would like to quit.  Meds not effective in past.  Vaping Use  . Vaping Use: Never used  Substance and Sexual Activity  . Alcohol use: Yes    Alcohol/week: 2.0 standard drinks    Types: 2 Cans of  beer per week    Comment: occasionally  . Drug use: No  . Sexual activity: Not on file  Other Topics Concern  . Not on file  Social History Narrative  . Not on file   Social Determinants of Health   Financial Resource Strain: Low Risk   . Difficulty of Paying Living Expenses: Not hard at all  Food Insecurity: No Food Insecurity  . Worried About Programme researcher, broadcasting/film/video in the Last Year: Never true  . Ran Out of Food in the Last Year: Never true  Transportation Needs: No Transportation Needs  . Lack of Transportation (Medical): No  . Lack of Transportation (Non-Medical): No  Physical Activity: Inactive  . Days of Exercise per Week: 0 days  . Minutes of Exercise per Session: 0 min  Stress: No Stress Concern Present  . Feeling of Stress : Not at all  Social Connections: Not on file    Tobacco Counseling Ready to quit: No Counseling given: Not Answered Comment: she quit for 8 years in 1980s and would like to quit.  Meds not effective in past.   Clinical Intake:  Pre-visit preparation completed: Yes  Pain : No/denies pain     Nutritional Status: BMI 25 -29 Overweight Nutritional Risks: Nausea/ vomitting/ diarrhea (vomitted once last week) Diabetes: No  How often do you need to have someone help you when you read instructions, pamphlets, or other written materials from your doctor or pharmacy?: 1 - Never What is the last grade level you completed in school?: GED  Diabetic? no  Interpreter Needed?: No  Information entered by :: NAllen LPN   Activities of Daily Living In your present state of health, do you have any difficulty performing the following activities: 02/02/2021 09/09/2020  Hearing? N N  Vision? N N  Difficulty concentrating or making decisions? N N  Walking or climbing stairs? N N  Dressing or bathing? N N  Doing errands, shopping? N -  Preparing Food and eating ? N -  Using the Toilet? N -  In the past six months, have you accidently leaked urine? Y -   Comment wears pads -  Do you have problems with loss of bowel control? N -  Managing your Medications? N -  Managing your Finances? N -  Housekeeping or managing your Housekeeping? N -  Some recent data might be hidden    Patient Care Team: Lorre Munroe, NP as PCP - General (Internal Medicine) Allena Katz Gillie Manners, DPM as Consulting Physician (Podiatry)  Indicate any recent Medical Services you may have received from other than [facility] providers in the past year (date may be approximate).     Assessment:   This is a routine wellness examination for Savage.  Hearing/Vision screen No exam data present  Dietary issues and exercise activities discussed: Current Exercise Habits: The patient does not participate in regular exercise at present  Goals Addressed  This Visit's Progress   . Patient Stated       02/02/2021, no goals      Depression Screen PHQ 2/9 Scores 02/02/2021 01/28/2020 09/30/2019 01/22/2019 10/10/2017 09/26/2017  PHQ - 2 Score 0 0 0 0 0 0  PHQ- 9 Score - - - - - 0    Fall Risk Fall Risk  02/02/2021 01/28/2020 09/30/2019 05/28/2019 01/22/2019  Falls in the past year? 0 0 0 0 0  Number falls in past yr: - 0 0 - -  Injury with Fall? - 0 - 0 -  Risk for fall due to : No Fall Risks - - - -  Follow up Falls evaluation completed;Education provided;Falls prevention discussed - - - -    FALL RISK PREVENTION PERTAINING TO THE HOME:  Any stairs in or around the home? Yes  If so, are there any without handrails? Yes  Home free of loose throw rugs in walkways, pet beds, electrical cords, etc? Yes  Adequate lighting in your home to reduce risk of falls? No   ASSISTIVE DEVICES UTILIZED TO PREVENT FALLS:  Life alert? No  Use of a cane, walker or w/c? No  Grab bars in the bathroom? No  Shower chair or bench in shower? No  Elevated toilet seat or a handicapped toilet? No   TIMED UP AND GO:  Was the test performed? No .   Cognitive Function:     6CIT Screen  02/02/2021 01/22/2019  What Year? 0 points 0 points  What month? 0 points 0 points  What time? 0 points 0 points  Count back from 20 4 points 0 points  Months in reverse 0 points 0 points  Repeat phrase 2 points 0 points  Total Score 6 0    Immunizations Immunization History  Administered Date(s) Administered  . Influenza Split 06/24/2014  . Influenza,inj,Quad PF,6+ Mos 07/06/2018  . Influenza-Unspecified 06/07/2012, 06/24/2015, 06/20/2016, 06/27/2017  . Pneumococcal Conjugate-13 12/27/2017  . Pneumococcal Polysaccharide-23 06/24/2014  . Zoster Recombinat (Shingrix) 01/05/2018, 07/06/2018    TDAP status: Due, Education has been provided regarding the importance of this vaccine. Advised may receive this vaccine at local pharmacy or Health Dept. Aware to provide a copy of the vaccination record if obtained from local pharmacy or Health Dept. Verbalized acceptance and understanding.  Flu Vaccine status: Up to date  Pneumococcal vaccine status: Up to date  Covid-19 vaccine status: Declined, Education has been provided regarding the importance of this vaccine but patient still declined. Advised may receive this vaccine at local pharmacy or Health Dept.or vaccine clinic. Aware to provide a copy of the vaccination record if obtained from local pharmacy or Health Dept. Verbalized acceptance and understanding.  Qualifies for Shingles Vaccine? Yes   Zostavax completed No   Shingrix Completed?: Yes  Screening Tests Health Maintenance  Topic Date Due  . COVID-19 Vaccine (1) Never done  . Hepatitis C Screening  Never done  . TETANUS/TDAP  Never done  . INFLUENZA VACCINE  04/19/2021  . PNA vac Low Risk Adult  Completed  . HPV VACCINES  Aged Out  . DEXA SCAN  Discontinued    Health Maintenance  Health Maintenance Due  Topic Date Due  . COVID-19 Vaccine (1) Never done  . Hepatitis C Screening  Never done  . TETANUS/TDAP  Never done    Colorectal cancer screening: No longer  required.   Mammogram status: No longer required due to age.  Bone Density status: not required  Lung Cancer Screening: (Low  Dose CT Chest recommended if Age 71-80 years, 30 pack-year currently smoking OR have quit w/in 15years.) does qualify.   Lung Cancer Screening Referral: Ct scan 04/06/2020  Additional Screening:  Hepatitis C Screening: does qualify; due  Vision Screening: Recommended annual ophthalmology exams for early detection of glaucoma and other disorders of the eye. Is the patient up to date with their annual eye exam?  Yes  Who is the provider or what is the name of the office in which the patient attends annual eye exams? Dr. Clearance Coots If pt is not established with a provider, would they like to be referred to a provider to establish care? No .   Dental Screening: Recommended annual dental exams for proper oral hygiene  Community Resource Referral / Chronic Care Management: CRR required this visit?  No   CCM required this visit?  No      Plan:     I have personally reviewed and noted the following in the patient's chart:   . Medical and social history . Use of alcohol, tobacco or illicit drugs  . Current medications and supplements including opioid prescriptions.  . Functional ability and status . Nutritional status . Physical activity . Advanced directives . List of other physicians . Hospitalizations, surgeries, and ER visits in previous 12 months . Vitals . Screenings to include cognitive, depression, and falls . Referrals and appointments  In addition, I have reviewed and discussed with patient certain preventive protocols, quality metrics, and best practice recommendations. A written personalized care plan for preventive services as well as general preventive health recommendations were provided to patient.     Barb Merino, LPN   6/96/2952   Nurse Notes:

## 2021-02-02 NOTE — Patient Instructions (Signed)
[name] , Thank you for taking time to come for your Medicare Wellness Visit. I appreciate your ongoing commitment to your health goals. Please review the following plan we discussed and let me know if I can assist you in the future.   Screening recommendations/referrals: Colonoscopy: not required Mammogram: not required Bone Density: not required Recommended yearly ophthalmology/optometry visit for glaucoma screening and checkup Recommended yearly dental visit for hygiene and checkup  Vaccinations: Influenza vaccine: due 04/19/2021 Pneumococcal vaccine: completed 12/27/2017 Tdap vaccine: due Shingles vaccine: completed   Covid-19: decline  Advanced directives: Advance directive discussed with you today.  Conditions/risks identified: smoking  Next appointment: Follow up in one year for your annual wellness visit    Preventive Care 65 Years and Older, Female Preventive care refers to lifestyle choices and visits with your health care provider that can promote health and wellness. What does preventive care include?  A yearly physical exam. This is also called an annual well check.  Dental exams once or twice a year.  Routine eye exams. Ask your health care provider how often you should have your eyes checked.  Personal lifestyle choices, including:  Daily care of your teeth and gums.  Regular physical activity.  Eating a healthy diet.  Avoiding tobacco and drug use.  Limiting alcohol use.  Practicing safe sex.  Taking low-dose aspirin every day.  Taking vitamin and mineral supplements as recommended by your health care provider. What happens during an annual well check? The services and screenings done by your health care provider during your annual well check will depend on your age, overall health, lifestyle risk factors, and family history of disease. Counseling  Your health care provider may ask you questions about your:  Alcohol use.  Tobacco use.  Drug  use.  Emotional well-being.  Home and relationship well-being.  Sexual activity.  Eating habits.  History of falls.  Memory and ability to understand (cognition).  Work and work Astronomer.  Reproductive health. Screening  You may have the following tests or measurements:  Height, weight, and BMI.  Blood pressure.  Lipid and cholesterol levels. These may be checked every 5 years, or more frequently if you are over 78 years old.  Skin check.  Lung cancer screening. You may have this screening every year starting at age 78 if you have a 30-pack-year history of smoking and currently smoke or have quit within the past 15 years.  Fecal occult blood test (FOBT) of the stool. You may have this test every year starting at age 78.  Flexible sigmoidoscopy or colonoscopy. You may have a sigmoidoscopy every 5 years or a colonoscopy every 10 years starting at age 78.  Hepatitis C blood test.  Hepatitis B blood test.  Sexually transmitted disease (STD) testing.  Diabetes screening. This is done by checking your blood sugar (glucose) after you have not eaten for a while (fasting). You may have this done every 1-3 years.  Bone density scan. This is done to screen for osteoporosis. You may have this done starting at age 78.  Mammogram. This may be done every 1-2 years. Talk to your health care provider about how often you should have regular mammograms. Talk with your health care provider about your test results, treatment options, and if necessary, the need for more tests. Vaccines  Your health care provider may recommend certain vaccines, such as:  Influenza vaccine. This is recommended every year.  Tetanus, diphtheria, and acellular pertussis (Tdap, Td) vaccine. You may need a  Td booster every 10 years.  Zoster vaccine. You may need this after age 78.  Pneumococcal 13-valent conjugate (PCV13) vaccine. One dose is recommended after age 78.  Pneumococcal polysaccharide  (PPSV23) vaccine. One dose is recommended after age 78. Talk to your health care provider about which screenings and vaccines you need and how often you need them. This information is not intended to replace advice given to you by your health care provider. Make sure you discuss any questions you have with your health care provider. Document Released: 10/02/2015 Document Revised: 05/25/2016 Document Reviewed: 07/07/2015 Elsevier Interactive Patient Education  2017 [place] Prevention in the Home Falls can cause injuries. They can happen to people of all ages. There are many things you can do to make your home safe and to help prevent falls. What can I do on the outside of my home?  Regularly fix the edges of walkways and driveways and fix any cracks.  Remove anything that might make you trip as you walk through a door, such as a raised step or threshold.  Trim any bushes or trees on the path to your home.  Use bright outdoor lighting.  Clear any walking paths of anything that might make someone trip, such as rocks or tools.  Regularly check to see if handrails are loose or broken. Make sure that both sides of any steps have handrails.  Any raised decks and porches should have guardrails on the edges.  Have any leaves, snow, or ice cleared regularly.  Use sand or salt on walking paths during winter.  Clean up any spills in your garage right away. This includes oil or grease spills. What can I do in the bathroom?  Use night lights.  Install grab bars by the toilet and in the tub and shower. Do not use towel bars as grab bars.  Use non-skid mats or decals in the tub or shower.  If you need to sit down in the shower, use a plastic, non-slip stool.  Keep the floor dry. Clean up any water that spills on the floor as soon as it happens.  Remove soap buildup in the tub or shower regularly.  Attach bath mats securely with double-sided non-slip rug tape.  Do not have  throw rugs and other things on the floor that can make you trip. What can I do in the bedroom?  Use night lights.  Make sure that you have a light by your bed that is easy to reach.  Do not use any sheets or blankets that are too big for your bed. They should not hang down onto the floor.  Have a firm chair that has side arms. You can use this for support while you get dressed.  Do not have throw rugs and other things on the floor that can make you trip. What can I do in the kitchen?  Clean up any spills right away.  Avoid walking on wet floors.  Keep items that you use a lot in easy-to-reach places.  If you need to reach something above you, use a strong step stool that has a grab bar.  Keep electrical cords out of the way.  Do not use floor polish or wax that makes floors slippery. If you must use wax, use non-skid floor wax.  Do not have throw rugs and other things on the floor that can make you trip. What can I do with my stairs?  Do not leave any items on the stairs.  Make sure that there are handrails on both sides of the stairs and use them. Fix handrails that are broken or loose. Make sure that handrails are as long as the stairways.  Check any carpeting to make sure that it is firmly attached to the stairs. Fix any carpet that is loose or worn.  Avoid having throw rugs at the top or bottom of the stairs. If you do have throw rugs, attach them to the floor with carpet tape.  Make sure that you have a light switch at the top of the stairs and the bottom of the stairs. If you do not have them, ask someone to add them for you. What else can I do to help prevent falls?  Wear shoes that:  Do not have high heels.  Have rubber bottoms.  Are comfortable and fit you well.  Are closed at the toe. Do not wear sandals.  If you use a stepladder:  Make sure that it is fully opened. Do not climb a closed stepladder.  Make sure that both sides of the stepladder are  locked into place.  Ask someone to hold it for you, if possible.  Clearly mark and make sure that you can see:  Any grab bars or handrails.  First and last steps.  Where the edge of each step is.  Use tools that help you move around (mobility aids) if they are needed. These include:  Canes.  Walkers.  Scooters.  Crutches.  Turn on the lights when you go into a dark area. Replace any light bulbs as soon as they burn out.  Set up your furniture so you have a clear path. Avoid moving your furniture around.  If any of your floors are uneven, fix them.  If there are any pets around you, be aware of where they are.  Review your medicines with your doctor. Some medicines can make you feel dizzy. This can increase your chance of falling. Ask your doctor what other things that you can do to help prevent falls. This information is not intended to replace advice given to you by your health care provider. Make sure you discuss any questions you have with your health care provider. Document Released: 07/02/2009 Document Revised: 02/11/2016 Document Reviewed: 10/10/2014 Elsevier Interactive Patient Education  2017 [employer].

## 2021-04-01 ENCOUNTER — Encounter: Payer: Self-pay | Admitting: Internal Medicine

## 2021-04-01 ENCOUNTER — Other Ambulatory Visit: Payer: Self-pay

## 2021-04-01 ENCOUNTER — Ambulatory Visit (INDEPENDENT_AMBULATORY_CARE_PROVIDER_SITE_OTHER): Payer: Medicare PPO | Admitting: Internal Medicine

## 2021-04-01 VITALS — BP 122/48 | HR 72 | Temp 97.1°F | Resp 17 | Ht 68.0 in | Wt 159.4 lb

## 2021-04-01 DIAGNOSIS — E781 Pure hyperglyceridemia: Secondary | ICD-10-CM | POA: Diagnosis not present

## 2021-04-01 DIAGNOSIS — Z0001 Encounter for general adult medical examination with abnormal findings: Secondary | ICD-10-CM

## 2021-04-01 DIAGNOSIS — J410 Simple chronic bronchitis: Secondary | ICD-10-CM | POA: Diagnosis not present

## 2021-04-01 NOTE — Progress Notes (Signed)
Subjective:    Patient ID: Kristine Le, female    DOB: [birth year], 80 y.o.   MRN: [id number]  HPI  Patient presents to the clinic today for her annual exam.    COPD: She reports chronic cough but denies shortness of breath.  She is not using any inhalers at this time.  There are no PFTs on file. She does continue to smoke.   HLD: Her last LDL was 87, triglycerides 790, 03/2020.  She is not taking any cholesterol-lowering medication at this time.  She does not consume a low-fat diet.  Flu: 06/2018 Tetanus: > 10 years ago Pneumovax: 06/2014 Prevnar: 12/2017 Zostavax: Never Shingrix: 2019 COVID: never Pap smear: No longer screening Mammogram: no longer screening Colon screening: no longer screening Bone density: no longer screening Vision screening: annually Dentist: annually  Diet: She does eat meat. She consumes fruits and veggies. She does eat fried foods. She drinks mostly water. Exercise: None  Review of Systems     Past Medical History:  Diagnosis Date   Allergy    CHF (congestive heart failure) (HCC)    Colon polyps    COPD (chronic obstructive pulmonary disease) (HCC)    PONV (postoperative nausea and vomiting)     Current Outpatient Medications  Medication Sig Dispense Refill   Ascorbic Acid (VITAMIN C) 1000 MG tablet Take 1,000 mg by mouth daily.     B Complex-C-E-Zn (B COMPLEX-C-E-ZINC) tablet Take 1 tablet by mouth daily.     cetirizine (ZYRTEC) 10 MG tablet Take 10 mg by mouth daily.     Cholecalciferol (VITAMIN D3) 1000 units CAPS Take by mouth.     Collagen 500 MG CAPS Take by mouth.     ibuprofen (ADVIL,MOTRIN) 200 MG tablet Take by mouth.     Zinc Sulfate (ZINC 15 PO) Take by mouth.     No current facility-administered medications for this visit.    Allergies  Allergen Reactions   Codeine Nausea And Vomiting    Pt does not want to take any additional codeine products.    Family History  Problem Relation Age of Onset   Breast cancer Cousin         maternal   Kidney cancer Father    Asthma Mother    Arthritis Sister    GER disease Sister    Diabetes Sister    Bone cancer Son    Kidney cancer Son     Social History   Socioeconomic History   Marital status: Widowed    Spouse name: Not on file   Number of children: Not on file   Years of education: Not on file   Highest education level: High school graduate  Occupational History   Occupation: retired  Tobacco Use   Smoking status: Every Day    Packs/day: 1.00    Years: 53.00    Pack years: 53.00    Types: Cigarettes   Smokeless tobacco: Never   Tobacco comments:    she quit for 8 years in 1980s and would like to quit.  Meds not effective in past.  Vaping Use   Vaping Use: Never used  Substance and Sexual Activity   Alcohol use: Yes    Alcohol/week: 2.0 standard drinks    Types: 2 Cans of beer per week    Comment: occasionally   Drug use: No   Sexual activity: Not on file  Other Topics Concern   Not on file  Social History Narrative   Not on file  Social Determinants of Health   Financial Resource Strain: Low Risk    Difficulty of Paying Living Expenses: Not hard at all  Food Insecurity: No Food Insecurity   Worried About Programme researcher, broadcasting/film/video in the Last Year: Never true   Ran Out of Food in the Last Year: Never true  Transportation Needs: No Transportation Needs   Lack of Transportation (Medical): No   Lack of Transportation (Non-Medical): No  Physical Activity: Inactive   Days of Exercise per Week: 0 days   Minutes of Exercise per Session: 0 min  Stress: No Stress Concern Present   Feeling of Stress : Not at all  Social Connections: Not on file  Intimate Partner Violence: Not on file     Constitutional: Denies fever, malaise, fatigue, headache or abrupt weight changes.  HEENT: Denies eye pain, eye redness, ear pain, ringing in the ears, wax buildup, runny nose, nasal congestion, bloody nose, or sore throat. Respiratory: Pt reports chronic  cough. Denies difficulty breathing, shortness of breath, or sputum production.   Cardiovascular: Denies chest pain, chest tightness, palpitations or swelling in the hands or feet.  Gastrointestinal: Denies abdominal pain, bloating, constipation, diarrhea or blood in the stool.  GU: Denies urgency, frequency, pain with urination, burning sensation, blood in urine, odor or discharge. Musculoskeletal: Denies decrease in range of motion, difficulty with gait, muscle pain or joint pain and swelling.  Skin: Denies redness, rashes, lesions or ulcercations.  Neurological: Denies dizziness, difficulty with memory, difficulty with speech or problems with balance and coordination.  Psych: Denies anxiety, depression, SI/HI.  No other specific complaints in a complete review of systems (except as listed in HPI above).  Objective:   Physical Exam BP (!) 122/48 (BP Location: Left Arm, Patient Position: Sitting, Cuff Size: Normal)   Pulse 72   Temp (!) 97.1 F (36.2 C) (Temporal)   Resp 17   Ht 5\' 8"  (1.727 m)   Wt 159 lb 6.4 oz (72.3 kg)   SpO2 98%   BMI 24.24 kg/m   Wt Readings from Last 3 Encounters:  02/02/21 170 lb (77.1 kg)  09/09/20 171 lb (77.6 kg)  04/03/20 169 lb (76.7 kg)    General: Appears her stated age, well developed, well nourished in NAD. Skin: Warm, dry and intact. No rashes noted. HEENT: Head: normal shape and size; Eyes: sclera white and EOMs intact;  Neck:  Neck supple, trachea midline. No masses, lumps or thyromegaly present.  Cardiovascular: Normal rate and rhythm. S1,S2 noted.  No murmur, rubs or gallops noted. No JVD or BLE edema. Bilateral carotid bruits noted. Pulmonary/Chest: Normal effort and positive vesicular breath sounds. No respiratory distress. No wheezes, rales or ronchi noted.  Abdomen: Soft and nontender. Normal bowel sounds. No distention or masses noted. Liver, spleen and kidneys non palpable. Musculoskeletal: Strength 5/5 BUE/BLE. No difficulty with  gait.  Neurological: Alert and oriented. Cranial nerves II-XII grossly intact. Coordination normal.  Psychiatric: Mood and affect normal. Behavior is normal. Judgment and thought content normal.    BMET    Component Value Date/Time   NA 141 03/30/2020 0850   NA 140 01/09/2015 1123   K 4.7 03/30/2020 0850   K 3.9 01/09/2015 1123   CL 106 03/30/2020 0850   CL 105 01/09/2015 1123   CO2 27 03/30/2020 0850   CO2 28 01/09/2015 1123   GLUCOSE 102 (H) 03/30/2020 0850   GLUCOSE 100 (H) 01/09/2015 1123   BUN 18 03/30/2020 0850   BUN 12 01/09/2015 1123  CREATININE 0.81 03/30/2020 0850   CALCIUM 9.6 03/30/2020 0850   CALCIUM 9.4 01/09/2015 1123   GFRNONAA 70 03/30/2020 0850   GFRAA 81 03/30/2020 0850    Lipid Panel     Component Value Date/Time   CHOL 158 03/30/2020 0850   TRIG 258 (H) 03/30/2020 0850   HDL 37 (L) 03/30/2020 0850   CHOLHDL 4.3 03/30/2020 0850   LDLCALC 87 03/30/2020 0850    CBC    Component Value Date/Time   WBC 6.9 03/30/2020 0850   RBC 4.54 03/30/2020 0850   HGB 15.0 03/30/2020 0850   HGB 14.7 01/09/2015 1123   HCT 45.6 (H) 03/30/2020 0850   HCT 43.0 01/09/2015 1123   PLT 137 (L) 03/30/2020 0850   PLT 128 (L) 01/09/2015 1123   MCV 100.4 (H) 03/30/2020 0850   MCV 97 01/09/2015 1123   MCH 33.0 03/30/2020 0850   MCHC 32.9 03/30/2020 0850   RDW 12.7 03/30/2020 0850   RDW 12.4 01/09/2015 1123   LYMPHSABS 2,153 03/30/2020 0850   LYMPHSABS 2.2 01/09/2015 1123   MONOABS 0.5 01/09/2015 1123   EOSABS 262 03/30/2020 0850   EOSABS 0.3 01/09/2015 1123   BASOSABS 28 03/30/2020 0850   BASOSABS 0.0 01/09/2015 1123   BASOSABS 1 01/09/2015 1123    Hgb A1C Lab Results  Component Value Date   HGBA1C 5.1 03/30/2020            Assessment & Plan:   Preventative hHalth Maintenance:  Encouraged her to get a flu shot in fall She declines tetanus or financial reasons, advised her if she gets better cut she will need to get this done Pneumovax and Prevnar  UTD Encouraged her to get her COVID-vaccine Shingrix UTD She does not wants to screen for cervical, breast, colon cancer She declines bone density at this time Encouraged her to consume a balanced diet and exercise regimen Advised her to see an eye doctor and dentist annually She declines labs today  RTC in 1 year, sooner if needed Nicki Reaper, NP This visit occurred during the SARS-CoV-2 public health emergency.  Safety protocols were in place, including screening questions prior to the visit, additional usage of staff PPE, and extensive cleaning of exam room while observing appropriate contact time as indicated for disinfecting solutions.

## 2021-04-01 NOTE — Patient Instructions (Signed)

## 2021-04-01 NOTE — Assessment & Plan Note (Signed)
She is not using any inhalers and does continue to smoke She does not want to do any more lung cancer screening

## 2021-04-01 NOTE — Assessment & Plan Note (Signed)
Encouraged her to consume a low-fat diet She declines repeat lipid profile today

## 2021-04-02 ENCOUNTER — Encounter: Payer: Medicare PPO | Admitting: Family Medicine

## 2021-04-29 DIAGNOSIS — H04123 Dry eye syndrome of bilateral lacrimal glands: Secondary | ICD-10-CM | POA: Diagnosis not present

## 2021-10-06 ENCOUNTER — Telehealth: Payer: Self-pay | Admitting: Acute Care

## 2021-10-06 NOTE — Telephone Encounter (Signed)
Spoke with pt regarding scheduling a f/u lung screening CT scan. Pt stated that she is no longer interested in having the CT scans done. Pt states that her PCP is aware of this. Nothing further needed.

## 2021-12-23 DIAGNOSIS — Z8249 Family history of ischemic heart disease and other diseases of the circulatory system: Secondary | ICD-10-CM | POA: Diagnosis not present

## 2021-12-23 DIAGNOSIS — Z825 Family history of asthma and other chronic lower respiratory diseases: Secondary | ICD-10-CM | POA: Diagnosis not present

## 2021-12-23 DIAGNOSIS — Z809 Family history of malignant neoplasm, unspecified: Secondary | ICD-10-CM | POA: Diagnosis not present

## 2021-12-23 DIAGNOSIS — Z833 Family history of diabetes mellitus: Secondary | ICD-10-CM | POA: Diagnosis not present

## 2021-12-23 DIAGNOSIS — R32 Unspecified urinary incontinence: Secondary | ICD-10-CM | POA: Diagnosis not present

## 2021-12-23 DIAGNOSIS — F17210 Nicotine dependence, cigarettes, uncomplicated: Secondary | ICD-10-CM | POA: Diagnosis not present

## 2021-12-23 DIAGNOSIS — J449 Chronic obstructive pulmonary disease, unspecified: Secondary | ICD-10-CM | POA: Diagnosis not present

## 2021-12-23 DIAGNOSIS — H547 Unspecified visual loss: Secondary | ICD-10-CM | POA: Diagnosis not present

## 2021-12-23 DIAGNOSIS — R030 Elevated blood-pressure reading, without diagnosis of hypertension: Secondary | ICD-10-CM | POA: Diagnosis not present

## 2022-01-18 ENCOUNTER — Telehealth: Payer: Self-pay

## 2022-01-18 NOTE — Telephone Encounter (Signed)
Left a message for patient to call back and schedule their Medicare Annual Wellness Visit (AWV) virtually, telephone or face to face. ?  ?Essex Perry, CMA ?[phone]  ?

## 2022-02-04 ENCOUNTER — Ambulatory Visit (INDEPENDENT_AMBULATORY_CARE_PROVIDER_SITE_OTHER): Payer: Medicare PPO

## 2022-02-04 DIAGNOSIS — Z0000 Encounter for general adult medical examination without abnormal findings: Secondary | ICD-10-CM | POA: Diagnosis not present

## 2022-02-04 NOTE — Progress Notes (Signed)
Subjective:   Kristine Le is a 81 y.o. female who presents for Medicare Annual (Subsequent) preventive examination.  Virtual Visit via Telephone Note  I connected with  Kristine Le on 02/04/22 at  9:15 AM EDT by telephone and verified that I am speaking with the correct person using two identifiers.  Location: Patient: home Provider: [facility] Persons participating in the virtual visit: Kristine Le   I discussed the limitations, risks, security and privacy concerns of performing an evaluation and management service by telephone and the availability of in person appointments. The patient expressed understanding and agreed to proceed.  Interactive audio and video telecommunications were attempted between this nurse and patient, however failed, due to patient having technical difficulties OR patient did not have access to video capability.  We continued and completed visit with audio only.  Some vital signs may be absent or patient reported.   Kristine Marker, LPN   Review of Systems     Cardiac Risk Factors include: advanced age (>59men, >71 women);sedentary lifestyle;smoking/ tobacco exposure     Objective:    There were no vitals filed for this visit. There is no height or weight on file to calculate BMI.     02/04/2022    9:24 AM 02/02/2021    9:45 AM 09/09/2020    7:53 AM 01/22/2019   11:02 AM 10/10/2017   10:30 AM  Advanced Directives  Does Patient Have a Medical Advance Directive? No No No No No  Would patient like information on creating a medical advance directive? No - Patient declined  No - Patient declined Yes (MAU/Ambulatory/Procedural Areas - Information given) Yes (MAU/Ambulatory/Procedural Areas - Information given)    Current Medications (verified) Outpatient Encounter Medications as of 02/04/2022  Medication Sig   Ascorbic Acid (VITAMIN C) 1000 MG tablet Take 1,000 mg by mouth daily.   B Complex-C-E-Zn (B COMPLEX-C-E-ZINC) tablet Take 1 tablet by  mouth daily.   cetirizine (ZYRTEC) 10 MG tablet Take 10 mg by mouth daily.   Cholecalciferol (VITAMIN D3) 1000 units CAPS Take by mouth.   Collagen 500 MG CAPS Take by mouth.   ibuprofen (ADVIL,MOTRIN) 200 MG tablet Take by mouth.   Zinc Sulfate (ZINC 15 PO) Take by mouth.   No facility-administered encounter medications on file as of 02/04/2022.    Allergies (verified) Codeine   History: Past Medical History:  Diagnosis Date   Allergy    CHF (congestive heart failure) (HCC)    Colon polyps    COPD (chronic obstructive pulmonary disease) (HCC)    PONV (postoperative nausea and vomiting)    Past Surgical History:  Procedure Laterality Date   CATARACT EXTRACTION W/PHACO Left 09/09/2020   Procedure: CATARACT EXTRACTION PHACO AND INTRAOCULAR LENS PLACEMENT (IOC) LEFT 8.52 01:05.1 13.1%;  Surgeon: Leandrew Koyanagi, MD;  Location: [facility];  Service: Ophthalmology;  Laterality: Left;   KNEE SURGERY     PARTIAL HYSTERECTOMY     THUMB ARTHROSCOPY     Family History  Problem Relation Age of Onset   Breast cancer Cousin        maternal   Kidney cancer Father    Asthma Mother    Arthritis Sister    GER disease Sister    Diabetes Sister    Bone cancer Son    Kidney cancer Son    Social History   Socioeconomic History   Marital status: Widowed    Spouse name: Not on file   Number of children: Not on file  Years of education: Not on file   Highest education level: High school graduate  Occupational History   Occupation: retired  Tobacco Use   Smoking status: Every Day    Packs/day: 1.00    Years: 53.00    Pack years: 53.00    Types: Cigarettes   Smokeless tobacco: Never   Tobacco comments:    she quit for 8 years in 1980s and would like to quit.  Meds not effective in past.  Vaping Use   Vaping Use: Never used  Substance and Sexual Activity   Alcohol use: Yes    Alcohol/week: 2.0 standard drinks    Types: 2 Cans of beer per week    Comment:  occasionally   Drug use: No   Sexual activity: Not on file  Other Topics Concern   Not on file  Social History Narrative   Pt lives alone   Social Determinants of Health   Financial Resource Strain: Low Risk    Difficulty of Paying Living Expenses: Not hard at all  Food Insecurity: No Food Insecurity   Worried About Charity fundraiser in the Last Year: Never true   Arboriculturist in the Last Year: Never true  Transportation Needs: No Transportation Needs   Lack of Transportation (Medical): No   Lack of Transportation (Non-Medical): No  Physical Activity: Inactive   Days of Exercise per Week: 0 days   Minutes of Exercise per Session: 0 min  Stress: No Stress Concern Present   Feeling of Stress : Not at all  Social Connections: Socially Isolated   Frequency of Communication with Friends and Family: More than three times a week   Frequency of Social Gatherings with Friends and Family: More than three times a week   Attends Religious Services: Never   Marine scientist or Organizations: No   Attends Archivist Meetings: Never   Marital Status: Widowed    Tobacco Counseling Ready to quit: No Counseling given: Not Answered Tobacco comments: she quit for 8 years in 1980s and would like to quit.  Meds not effective in past.   Clinical Intake:  Pre-visit preparation completed: Yes  Pain : No/denies pain     Nutritional Risks: None Diabetes: No  How often do you need to have someone help you when you read instructions, pamphlets, or other written materials from your doctor or pharmacy?: 1 - Never    Interpreter Needed?: No  Information entered by :: Kristine Marker LPN   Activities of Daily Living    02/04/2022    9:26 AM 04/01/2021    9:00 AM  In your present state of health, do you have any difficulty performing the following activities:  Hearing? 0 0  Vision? 0 0  Difficulty concentrating or making decisions? 0 0  Walking or climbing stairs? 0 0   Dressing or bathing? 0 0  Doing errands, shopping? 0 0  Preparing Food and eating ? N   Using the Toilet? N   In the past six months, have you accidently leaked urine? Y   Comment wears discrete depends for protection   Do you have problems with loss of bowel control? N   Managing your Medications? N   Managing your Finances? N   Housekeeping or managing your Housekeeping? N     Patient Care Team: Jearld Fenton, NP as PCP - General (Internal Medicine) Posey Pronto Thomasene Lot, DPM as Consulting Physician (Podiatry)  Indicate any recent Medical Services you  may have received from other than [facility] providers in the past year (date may be approximate).     Assessment:   This is a routine wellness examination for [place].  Hearing/Vision screen Hearing Screening - Comments:: Pt denies hearing difficulty Vision Screening - Comments:: Annual vision screenings done by Dr. Clearance Coots  Dietary issues and exercise activities discussed: Current Exercise Habits: The patient does not participate in regular exercise at present, Exercise limited by: None identified   Goals Addressed             This Visit's Progress    Exercise 3x per week (30 min per time)   Not on track    Recommend walking 10-15 min 3x a week         Depression Screen    02/04/2022    9:22 AM 04/01/2021    9:00 AM 02/02/2021    9:46 AM 01/28/2020    9:48 AM 09/30/2019   11:18 AM 01/22/2019   11:01 AM 10/10/2017   10:30 AM  PHQ 2/9 Scores  PHQ - 2 Score 0 0 0 0 0 0 0  PHQ- 9 Score  0         Fall Risk    02/04/2022    9:25 AM 04/01/2021    9:00 AM 02/02/2021    9:46 AM 01/28/2020    9:48 AM 09/30/2019   11:18 AM  Fall Risk   Falls in the past year? 0 0 0 0 0  Number falls in past yr: 0 0  0 0  Injury with Fall? 0 0  0   Risk for fall due to : No Fall Risks No Fall Risks No Fall Risks    Follow up Falls prevention discussed Falls evaluation completed Falls evaluation completed;Education provided;Falls prevention discussed       FALL RISK PREVENTION PERTAINING TO THE HOME:  Any stairs in or around the home? Yes  If so, are there any without handrails? No  Home free of loose throw rugs in walkways, pet beds, electrical cords, etc? Yes  Adequate lighting in your home to reduce risk of falls? Yes   ASSISTIVE DEVICES UTILIZED TO PREVENT FALLS:  Life alert? No  Use of a cane, walker or w/c? No  Grab bars in the bathroom? Yes  Shower chair or bench in shower? No  Elevated toilet seat or a handicapped toilet? No   TIMED UP AND GO:  Was the test performed? No . Telephonic visit  Cognitive Function: Normal cognitive status assessed by direct observation by this Nurse Health Advisor. No abnormalities found.          02/02/2021    9:50 AM 01/22/2019   11:02 AM  6CIT Screen  What Year? 0 points 0 points  What month? 0 points 0 points  What time? 0 points 0 points  Count back from 20 4 points 0 points  Months in reverse 0 points 0 points  Repeat phrase 2 points 0 points  Total Score 6 points 0 points    Immunizations Immunization History  Administered Date(s) Administered   Influenza Split 06/24/2014   Influenza,inj,Quad PF,6+ Mos 07/06/2018   Influenza-Unspecified 06/07/2012, 06/24/2015, 06/20/2016, 06/27/2017   Pneumococcal Conjugate-13 12/27/2017   Pneumococcal Polysaccharide-23 06/24/2014   Zoster Recombinat (Shingrix) 01/05/2018, 07/06/2018    TDAP status: Due, Education has been provided regarding the importance of this vaccine. Advised may receive this vaccine at local pharmacy or Health Dept. Aware to provide a copy of the vaccination record if  obtained from local pharmacy or Health Dept. Verbalized acceptance and understanding.  Flu Vaccine status: Due, Education has been provided regarding the importance of this vaccine. Advised may receive this vaccine at local pharmacy or Health Dept. Aware to provide a copy of the vaccination record if obtained from local pharmacy or Health Dept.  Verbalized acceptance and understanding.  Pneumococcal vaccine status: Up to date  Covid-19 vaccine status: Declined, Education has been provided regarding the importance of this vaccine but patient still declined. Advised may receive this vaccine at local pharmacy or Health Dept.or vaccine clinic. Aware to provide a copy of the vaccination record if obtained from local pharmacy or Health Dept. Verbalized acceptance and understanding.  Qualifies for Shingles Vaccine? Yes   Zostavax completed No   Shingrix Completed?: Yes  Screening Tests Health Maintenance  Topic Date Due   COVID-19 Vaccine (1) 02/20/2022 (Originally 04/08/1941)   TETANUS/TDAP  04/01/2022 (Originally 10/08/1959)   Hepatitis C Screening  04/01/2022 (Originally 10/08/1958)   INFLUENZA VACCINE  04/19/2022   Pneumonia Vaccine 28+ Years old  Completed   Zoster Vaccines- Shingrix  Completed   HPV VACCINES  Aged Out   DEXA SCAN  Discontinued    Health Maintenance  There are no preventive care reminders to display for this patient.   Colorectal cancer screening: No longer required.   Mammogram status: No longer required due to age.  Bone density status: no longer required due to age  Lung Cancer Screening: (Low Dose CT Chest recommended if Age 58-80 years, 30 pack-year currently smoking OR have quit w/in 15years.) does qualify. Last completed 04/03/20. Pt declines repeat screening at this time.    Additional Screening:  Hepatitis C Screening: does qualify; due  Vision Screening: Recommended annual ophthalmology exams for early detection of glaucoma and other disorders of the eye. Is the patient up to date with their annual eye exam?  Yes  Who is the provider or what is the name of the office in which the patient attends annual eye exams? Dr. Wyatt Portela.   Dental Screening: Recommended annual dental exams for proper oral hygiene  Community Resource Referral / Chronic Care Management: CRR required this visit?  No   CCM  required this visit?  No      Plan:     I have personally reviewed and noted the following in the patient's chart:   Medical and social history Use of alcohol, tobacco or illicit drugs  Current medications and supplements including opioid prescriptions.  Functional ability and status Nutritional status Physical activity Advanced directives List of other physicians Hospitalizations, surgeries, and ER visits in previous 12 months Vitals Screenings to include cognitive, depression, and falls Referrals and appointments  In addition, I have reviewed and discussed with patient certain preventive protocols, quality metrics, and best practice recommendations. A written personalized care plan for preventive services as well as general preventive health recommendations were provided to patient.     Kristine Marker, LPN   [id number]   Nurse Notes: none

## 2022-02-04 NOTE — Patient Instructions (Signed)
Kristine Le , Thank you for taking time to come for your Medicare Wellness Visit. I appreciate your ongoing commitment to your health goals. Please review the following plan we discussed and let me know if I can assist you in the future.   Screening recommendations/referrals: Colonoscopy: no longer required Mammogram: no longer required Bone Density: no longer required Recommended yearly ophthalmology/optometry visit for glaucoma screening and checkup Recommended yearly dental visit for hygiene and checkup  Vaccinations: Influenza vaccine: due fall 2023 Pneumococcal vaccine: done 12/27/17 Tdap vaccine: due Shingles vaccine: done 01/05/18 & 07/06/18   Covid-19:declined  Advanced directives: Please bring a copy of your health care power of attorney and living will to the office at your convenience once you have completed those documents  Conditions/risks identified: If you wish to quit smoking, help is available. For free tobacco cessation program offerings call the [facility] at [phone] or Live Well Line at 610-612-8499. You may also visit [email or web address] or email [email or web address] for more information on other programs.   Next appointment: Follow up in one year for your annual wellness visit    Preventive Care 65 Years and Older, Female Preventive care refers to lifestyle choices and visits with your health care provider that can promote health and wellness. What does preventive care include? A yearly physical exam. This is also called an annual well check. Dental exams once or twice a year. Routine eye exams. Ask your health care provider how often you should have your eyes checked. Personal lifestyle choices, including: Daily care of your teeth and gums. Regular physical activity. Eating a healthy diet. Avoiding tobacco and drug use. Limiting alcohol use. Practicing safe sex. Taking low-dose aspirin every day. Taking vitamin and mineral  supplements as recommended by your health care provider. What happens during an annual well check? The services and screenings done by your health care provider during your annual well check will depend on your age, overall health, lifestyle risk factors, and family history of disease. Counseling  Your health care provider may ask you questions about your: Alcohol use. Tobacco use. Drug use. Emotional well-being. Home and relationship well-being. Sexual activity. Eating habits. History of falls. Memory and ability to understand (cognition). Work and work Astronomer. Reproductive health. Screening  You may have the following tests or measurements: Height, weight, and BMI. Blood pressure. Lipid and cholesterol levels. These may be checked every 5 years, or more frequently if you are over 32 years old. Skin check. Lung cancer screening. You may have this screening every year starting at age 25 if you have a 30-pack-year history of smoking and currently smoke or have quit within the past 15 years. Fecal occult blood test (FOBT) of the stool. You may have this test every year starting at age 82. Flexible sigmoidoscopy or colonoscopy. You may have a sigmoidoscopy every 5 years or a colonoscopy every 10 years starting at age 70. Hepatitis C blood test. Hepatitis B blood test. Sexually transmitted disease (STD) testing. Diabetes screening. This is done by checking your blood sugar (glucose) after you have not eaten for a while (fasting). You may have this done every 1-3 years. Bone density scan. This is done to screen for osteoporosis. You may have this done starting at age 80. Mammogram. This may be done every 1-2 years. Talk to your health care provider about how often you should have regular mammograms. Talk with your health care provider about your test results, treatment options, and if necessary, the need for  more tests. Vaccines  Your health care provider may recommend certain  vaccines, such as: Influenza vaccine. This is recommended every year. Tetanus, diphtheria, and acellular pertussis (Tdap, Td) vaccine. You may need a Td booster every 10 years. Zoster vaccine. You may need this after age 36. Pneumococcal 13-valent conjugate (PCV13) vaccine. One dose is recommended after age 50. Pneumococcal polysaccharide (PPSV23) vaccine. One dose is recommended after age 53. Talk to your health care provider about which screenings and vaccines you need and how often you need them. This information is not intended to replace advice given to you by your health care provider. Make sure you discuss any questions you have with your health care provider. Document Released: 10/02/2015 Document Revised: 05/25/2016 Document Reviewed: 07/07/2015 Elsevier Interactive Patient Education  2017 [place] Prevention in the Home Falls can cause injuries. They can happen to people of all ages. There are many things you can do to make your home safe and to help prevent falls. What can I do on the outside of my home? Regularly fix the edges of walkways and driveways and fix any cracks. Remove anything that might make you trip as you walk through a door, such as a raised step or threshold. Trim any bushes or trees on the path to your home. Use bright outdoor lighting. Clear any walking paths of anything that might make someone trip, such as rocks or tools. Regularly check to see if handrails are loose or broken. Make sure that both sides of any steps have handrails. Any raised decks and porches should have guardrails on the edges. Have any leaves, snow, or ice cleared regularly. Use sand or salt on walking paths during winter. Clean up any spills in your garage right away. This includes oil or grease spills. What can I do in the bathroom? Use night lights. Install grab bars by the toilet and in the tub and shower. Do not use towel bars as grab bars. Use non-skid mats or decals in  the tub or shower. If you need to sit down in the shower, use a plastic, non-slip stool. Keep the floor dry. Clean up any water that spills on the floor as soon as it happens. Remove soap buildup in the tub or shower regularly. Attach bath mats securely with double-sided non-slip rug tape. Do not have throw rugs and other things on the floor that can make you trip. What can I do in the bedroom? Use night lights. Make sure that you have a light by your bed that is easy to reach. Do not use any sheets or blankets that are too big for your bed. They should not hang down onto the floor. Have a firm chair that has side arms. You can use this for support while you get dressed. Do not have throw rugs and other things on the floor that can make you trip. What can I do in the kitchen? Clean up any spills right away. Avoid walking on wet floors. Keep items that you use a lot in easy-to-reach places. If you need to reach something above you, use a strong step stool that has a grab bar. Keep electrical cords out of the way. Do not use floor polish or wax that makes floors slippery. If you must use wax, use non-skid floor wax. Do not have throw rugs and other things on the floor that can make you trip. What can I do with my stairs? Do not leave any items on the stairs. Make  sure that there are handrails on both sides of the stairs and use them. Fix handrails that are broken or loose. Make sure that handrails are as long as the stairways. Check any carpeting to make sure that it is firmly attached to the stairs. Fix any carpet that is loose or worn. Avoid having throw rugs at the top or bottom of the stairs. If you do have throw rugs, attach them to the floor with carpet tape. Make sure that you have a light switch at the top of the stairs and the bottom of the stairs. If you do not have them, ask someone to add them for you. What else can I do to help prevent falls? Wear shoes that: Do not have high  heels. Have rubber bottoms. Are comfortable and fit you well. Are closed at the toe. Do not wear sandals. If you use a stepladder: Make sure that it is fully opened. Do not climb a closed stepladder. Make sure that both sides of the stepladder are locked into place. Ask someone to hold it for you, if possible. Clearly mark and make sure that you can see: Any grab bars or handrails. First and last steps. Where the edge of each step is. Use tools that help you move around (mobility aids) if they are needed. These include: Canes. Walkers. Scooters. Crutches. Turn on the lights when you go into a dark area. Replace any light bulbs as soon as they burn out. Set up your furniture so you have a clear path. Avoid moving your furniture around. If any of your floors are uneven, fix them. If there are any pets around you, be aware of where they are. Review your medicines with your doctor. Some medicines can make you feel dizzy. This can increase your chance of falling. Ask your doctor what other things that you can do to help prevent falls. This information is not intended to replace advice given to you by your health care provider. Make sure you discuss any questions you have with your health care provider. Document Released: 07/02/2009 Document Revised: 02/11/2016 Document Reviewed: 10/10/2014 Elsevier Interactive Patient Education  2017 [employer].

## 2022-02-08 ENCOUNTER — Ambulatory Visit: Payer: Medicare PPO

## 2022-02-13 IMAGING — CT CT CHEST LUNG CANCER SCREENING LOW DOSE W/O CM
2 of 5 series · 15 of 40 positions shown, 18 images · non-contrast
Comparison: 10/19/2018 screening chest CT.

CLINICAL DATA: 79-year-old asymptomatic female current smoker with
53 pack-year smoking history.

EXAM:
CT CHEST WITHOUT CONTRAST LOW-DOSE FOR LUNG CANCER SCREENING
TECHNIQUE: Multidetector CT imaging of the chest was performed following the
standard protocol without IV contrast.

[Series 3: lung 1.00 · axial · 0.63mm/px · z∈[-1210,-901]mm · 12 of 341 slices shown, 15 images]
[im 16/341  mediastinal]
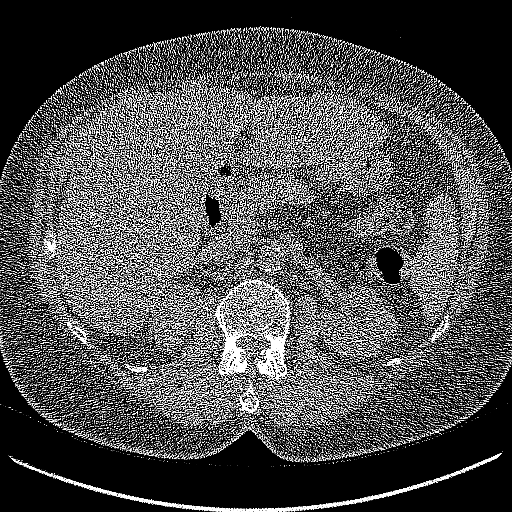
[im 16/341  lung]
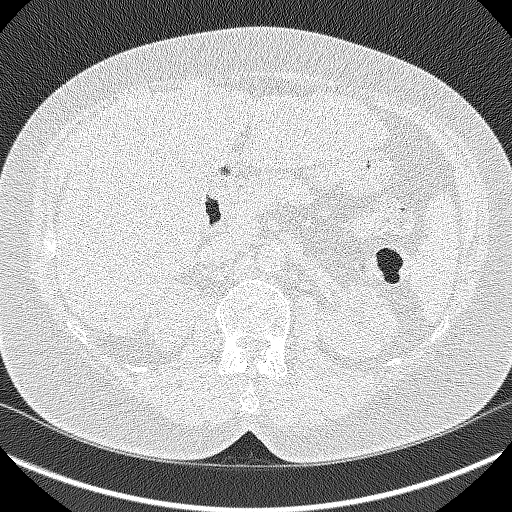
[im 47/341  lung]
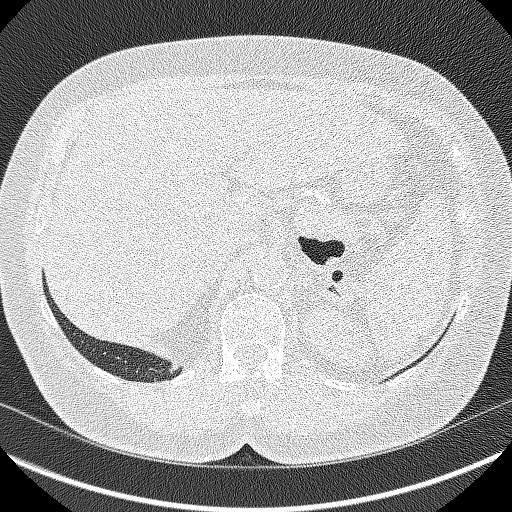
[im 78/341  lung]
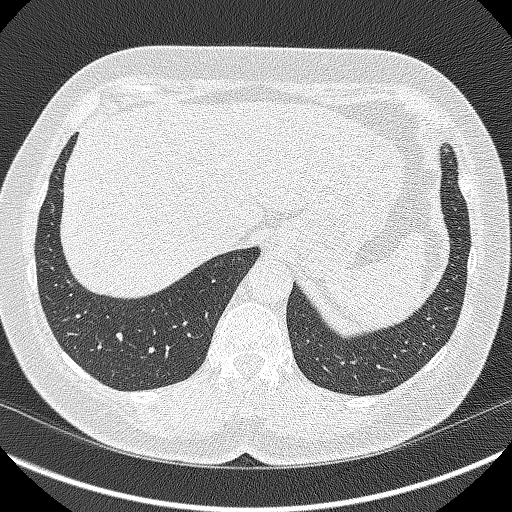
[im 109/341  lung]
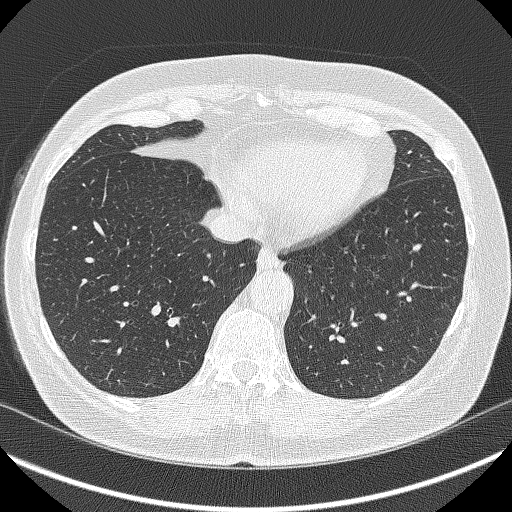
[im 124/341  mediastinal]
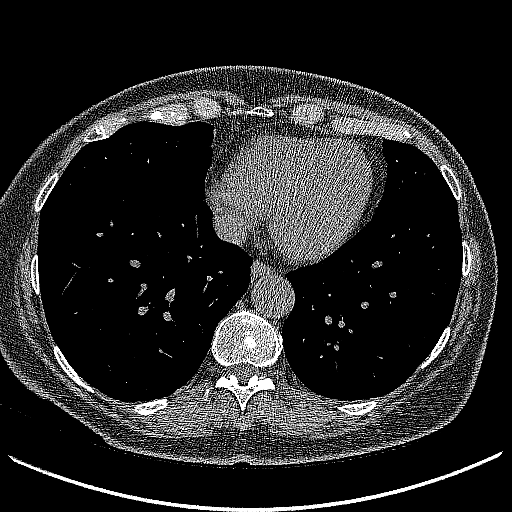
[im 124/341  lung]
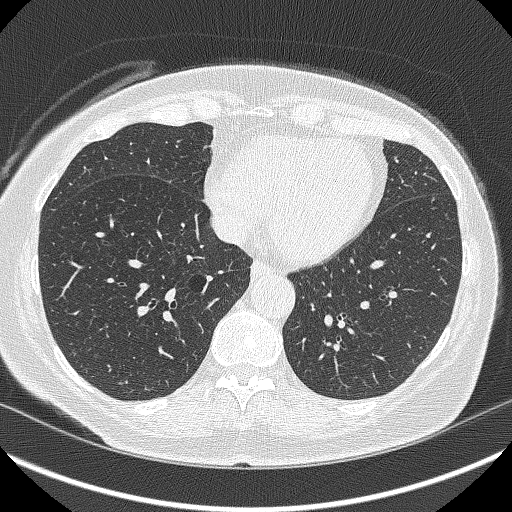
[im 155/341  lung]
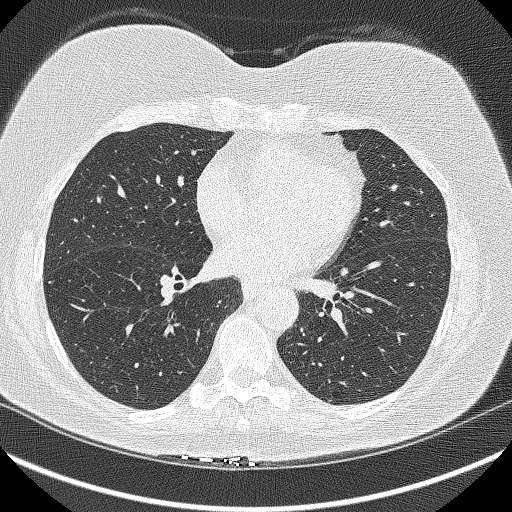
[im 186/341  lung]
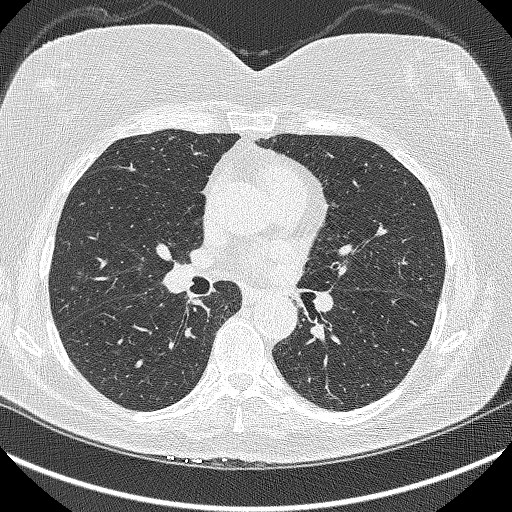
[im 217/341  lung]
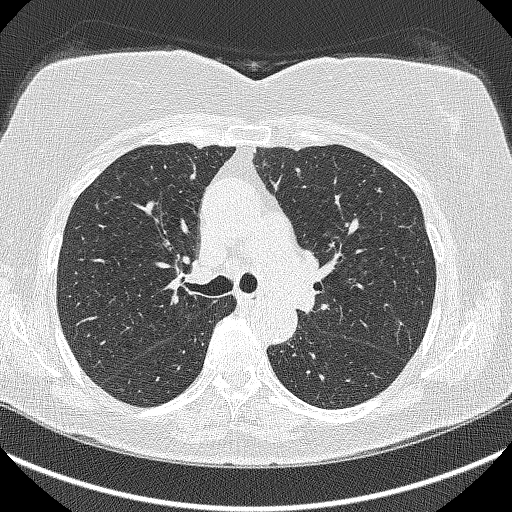
[im 232/341  mediastinal]
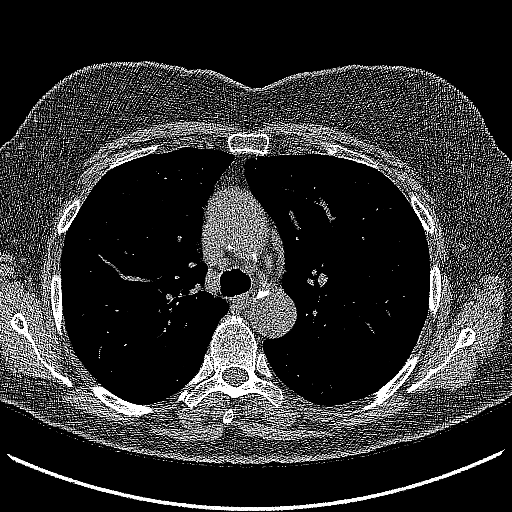
[im 232/341  lung]
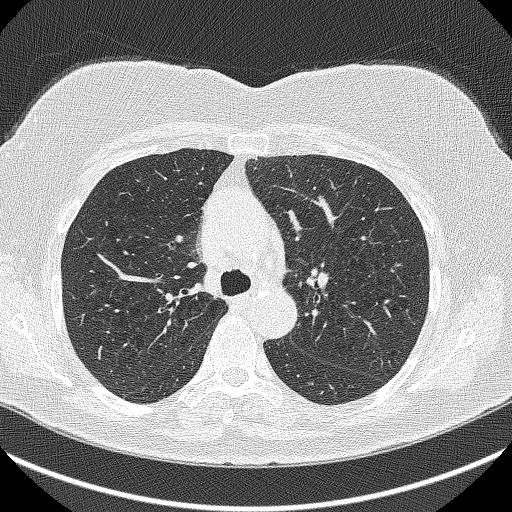
[im 263/341  lung]
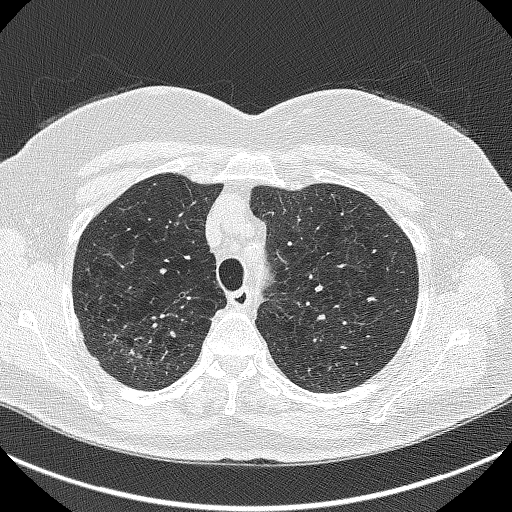
[im 294/341  lung]
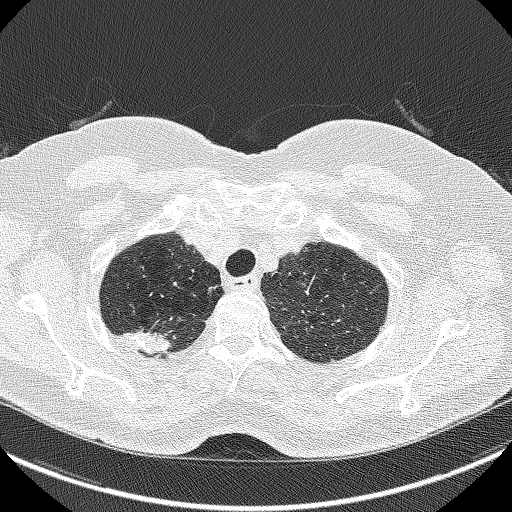
[im 325/341  lung]
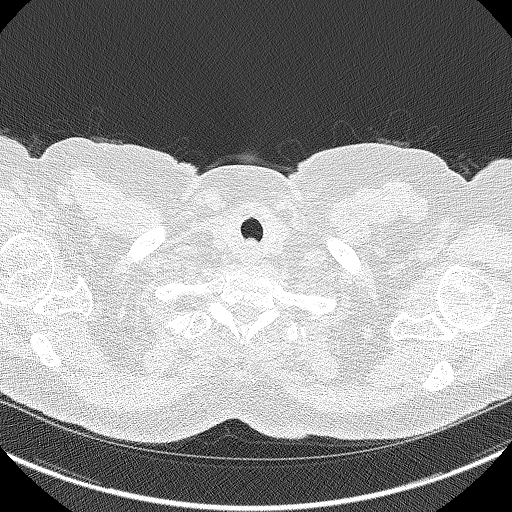

[Series 4: coronals lung 1.00 cor · coronal · 0.63mm/px · 3 of 275 slices shown]
[im 55/275  lung]
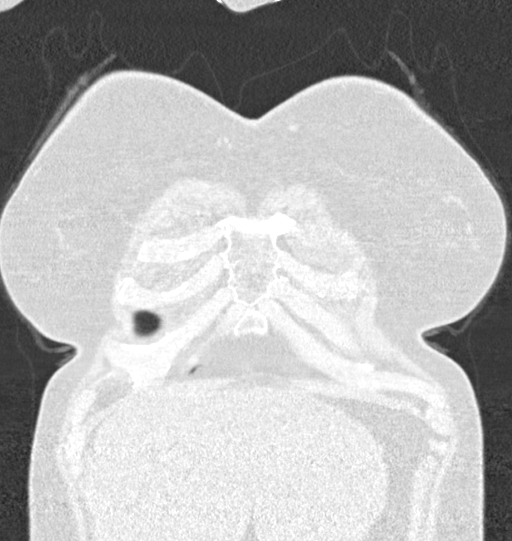
[im 110/275  lung]
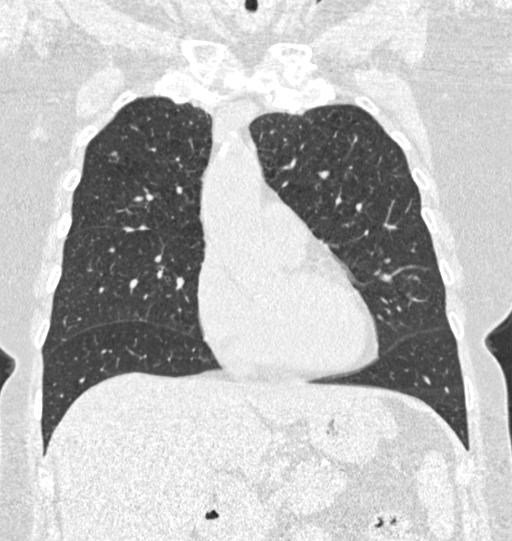
[im 165/275  lung]
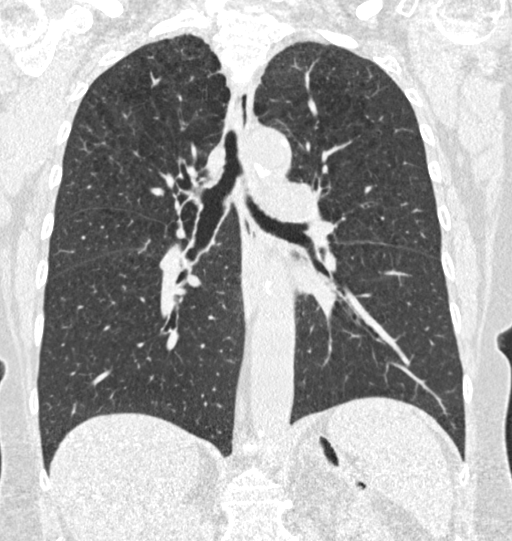

[15 of 40 positions shown; findings below may reference images not displayed]

FINDINGS: Cardiovascular: Normal heart size. No significant pericardial
effusion/thickening. Left anterior descending and right coronary
atherosclerosis. Atherosclerotic nonaneurysmal thoracic aorta.
Normal caliber pulmonary arteries.

Mediastinum/Nodes: No discrete thyroid nodules. Unremarkable
esophagus. No pathologically enlarged axillary, mediastinal or hilar
lymph nodes, noting limited sensitivity for the detection of hilar
adenopathy on this noncontrast study.

Lungs/Pleura: No pneumothorax. No pleural effusion. Moderate
centrilobular and paraseptal emphysema with diffuse bronchial wall
thickening. Chronic coarsely calcified subpleural bandlike focus of
consolidation in the posterior apical right upper lobe is unchanged.
No acute consolidative airspace disease or lung masses. No
significant growth of the previously visualized scattered pulmonary
nodules. No new significant pulmonary nodules.

Upper abdomen: No acute abnormality.

Musculoskeletal: No aggressive appearing focal osseous lesions. Mild
thoracic spondylosis.
IMPRESSION: 1. Lung-RADS 2, benign appearance or behavior. Continue annual
screening with low-dose chest CT without contrast in 12 months.
2. Two-vessel coronary atherosclerosis.
3. Aortic Atherosclerosis (69QLX-0G1.1) and Emphysema ([id number]).

## 2022-04-04 ENCOUNTER — Encounter: Payer: Self-pay | Admitting: Internal Medicine

## 2022-04-04 ENCOUNTER — Ambulatory Visit (INDEPENDENT_AMBULATORY_CARE_PROVIDER_SITE_OTHER): Payer: Medicare PPO | Admitting: Internal Medicine

## 2022-04-04 VITALS — BP 136/78 | HR 73 | Temp 96.9°F | Ht 67.0 in | Wt 154.0 lb

## 2022-04-04 DIAGNOSIS — J410 Simple chronic bronchitis: Secondary | ICD-10-CM

## 2022-04-04 DIAGNOSIS — E781 Pure hyperglyceridemia: Secondary | ICD-10-CM | POA: Diagnosis not present

## 2022-04-04 DIAGNOSIS — Z1159 Encounter for screening for other viral diseases: Secondary | ICD-10-CM

## 2022-04-04 DIAGNOSIS — D696 Thrombocytopenia, unspecified: Secondary | ICD-10-CM | POA: Diagnosis not present

## 2022-04-04 DIAGNOSIS — I700 Atherosclerosis of aorta: Secondary | ICD-10-CM

## 2022-04-04 DIAGNOSIS — Z0001 Encounter for general adult medical examination with abnormal findings: Secondary | ICD-10-CM

## 2022-04-04 MED ORDER — ASPIRIN 81 MG PO TBEC
81.0000 mg | DELAYED_RELEASE_TABLET | Freq: Every day | ORAL | 12 refills | Status: AC
Start: 2022-04-04 — End: ?

## 2022-04-04 NOTE — Patient Instructions (Signed)
Health Maintenance for Postmenopausal Women Menopause is a normal process in which your ability to get pregnant comes to an end. This process happens slowly over many months or years, usually between the ages of 48 and 55. Menopause is complete when you have missed your menstrual period for 12 months. It is important to talk with your health care provider about some of the most common conditions that affect women after menopause (postmenopausal women). These include heart disease, cancer, and bone loss (osteoporosis). Adopting a healthy lifestyle and getting preventive care can help to promote your health and wellness. The actions you take can also lower your chances of developing some of these common conditions. What are the signs and symptoms of menopause? During menopause, you may have the following symptoms: Hot flashes. These can be moderate or severe. Night sweats. Decrease in sex drive. Mood swings. Headaches. Tiredness (fatigue). Irritability. Memory problems. Problems falling asleep or staying asleep. Talk with your health care provider about treatment options for your symptoms. Do I need hormone replacement therapy? Hormone replacement therapy is effective in treating symptoms that are caused by menopause, such as hot flashes and night sweats. Hormone replacement carries certain risks, especially as you become older. If you are thinking about using estrogen or estrogen with progestin, discuss the benefits and risks with your health care provider. How can I reduce my risk for heart disease and stroke? The risk of heart disease, heart attack, and stroke increases as you age. One of the causes may be a change in the body's hormones during menopause. This can affect how your body uses dietary fats, triglycerides, and cholesterol. Heart attack and stroke are medical emergencies. There are many things that you can do to help prevent heart disease and stroke. Watch your blood pressure High  blood pressure causes heart disease and increases the risk of stroke. This is more likely to develop in people who have high blood pressure readings or are overweight. Have your blood pressure checked: Every 3-5 years if you are 18-39 years of age. Every year if you are 40 years old or older. Eat a healthy diet  Eat a diet that includes plenty of vegetables, fruits, low-fat dairy products, and lean protein. Do not eat a lot of foods that are high in solid fats, added sugars, or sodium. Get regular exercise Get regular exercise. This is one of the most important things you can do for your health. Most adults should: Try to exercise for at least 150 minutes each week. The exercise should increase your heart rate and make you sweat (moderate-intensity exercise). Try to do strengthening exercises at least twice each week. Do these in addition to the moderate-intensity exercise. Spend less time sitting. Even light physical activity can be beneficial. Other tips Work with your health care provider to achieve or maintain a healthy weight. Do not use any products that contain nicotine or tobacco. These products include cigarettes, chewing tobacco, and vaping devices, such as e-cigarettes. If you need help quitting, ask your health care provider. Know your numbers. Ask your health care provider to check your cholesterol and your blood sugar (glucose). Continue to have your blood tested as directed by your health care provider. Do I need screening for cancer? Depending on your health history and family history, you may need to have cancer screenings at different stages of your life. This may include screening for: Breast cancer. Cervical cancer. Lung cancer. Colorectal cancer. What is my risk for osteoporosis? After menopause, you may be   at increased risk for osteoporosis. Osteoporosis is a condition in which bone destruction happens more quickly than new bone creation. To help prevent osteoporosis or  the bone fractures that can happen because of osteoporosis, you may take the following actions: If you are 19-50 years old, get at least 1,000 mg of calcium and at least 600 international units (IU) of vitamin D per day. If you are older than age 50 but younger than age 70, get at least 1,200 mg of calcium and at least 600 international units (IU) of vitamin D per day. If you are older than age 70, get at least 1,200 mg of calcium and at least 800 international units (IU) of vitamin D per day. Smoking and drinking excessive alcohol increase the risk of osteoporosis. Eat foods that are rich in calcium and vitamin D, and do weight-bearing exercises several times each week as directed by your health care provider. How does menopause affect my mental health? Depression may occur at any age, but it is more common as you become older. Common symptoms of depression include: Feeling depressed. Changes in sleep patterns. Changes in appetite or eating patterns. Feeling an overall lack of motivation or enjoyment of activities that you previously enjoyed. Frequent crying spells. Talk with your health care provider if you think that you are experiencing any of these symptoms. General instructions See your health care provider for regular wellness exams and vaccines. This may include: Scheduling regular health, dental, and eye exams. Getting and maintaining your vaccines. These include: Influenza vaccine. Get this vaccine each year before the flu season begins. Pneumonia vaccine. Shingles vaccine. Tetanus, diphtheria, and pertussis (Tdap) booster vaccine. Your health care provider may also recommend other immunizations. Tell your health care provider if you have ever been abused or do not feel safe at home. Summary Menopause is a normal process in which your ability to get pregnant comes to an end. This condition causes hot flashes, night sweats, decreased interest in sex, mood swings, headaches, or lack  of sleep. Treatment for this condition may include hormone replacement therapy. Take actions to keep yourself healthy, including exercising regularly, eating a healthy diet, watching your weight, and checking your blood pressure and blood sugar levels. Get screened for cancer and depression. Make sure that you are up to date with all your vaccines. This information is not intended to replace advice given to you by your health care provider. Make sure you discuss any questions you have with your health care provider. Document Revised: 01/25/2021 Document Reviewed: 01/25/2021 Elsevier Patient Education  2023 [employer]  

## 2022-04-04 NOTE — Assessment & Plan Note (Signed)
CBC today.  

## 2022-04-04 NOTE — Assessment & Plan Note (Signed)
C-Met and lipid profile today Encouraged her to consume a low-fat diet We will have her start a baby aspirin

## 2022-04-04 NOTE — Assessment & Plan Note (Signed)
Encourage smoking cessation, she declines at this time

## 2022-04-04 NOTE — Progress Notes (Signed)
Subjective:    Patient ID: Kristine Le, female    DOB: 11-16-1940, 81 y.o.   MRN: [id number]  HPI  Patient presents to clinic today for her annual exam.  She is also due to follow-up chronic conditions.  COPD: She has a chronic cough but denies shortness of breath.  She is not using any inhalers at this time.  There are no PFTs on file.  HLD with Aortic Atherosclerosis: Her last LDL was 87, triglycerides 258, 03/2020.  She is not taking any cholesterol-lowering medication at this time.  She does not consume a low-fat diet.  Thrombocytopenia: Her last platelet count was 137, 03/2020.  She is not taking any aspirin at this time.  She does not follow with hematology.    Stress Incontinence: She manages this with incontinence pads.  She does not follow with urology.  Flu: 06/2021 Tetanus: > 10 years ago COVID: never Pneumovax: 06/2014 Prevnar: 12/2017 Shingrix: 12/2017, 06/2018 Pap smear: Hysterectomy Mammogram: 02/2015 Bone density: never Colon screening: 05/2015 Vision screening: as needed Dentist: biannually  Diet: She does eat meat. She consumes fruits and veggies. She does eat some fried foods. She drinks mostly water, some soda. Exercise: None  Review of Systems  Past Medical History:  Diagnosis Date   Allergy    CHF (congestive heart failure) (HCC)    Colon polyps    COPD (chronic obstructive pulmonary disease) (HCC)    PONV (postoperative nausea and vomiting)     Current Outpatient Medications  Medication Sig Dispense Refill   Ascorbic Acid (VITAMIN C) 1000 MG tablet Take 1,000 mg by mouth daily.     B Complex-C-E-Zn (B COMPLEX-C-E-ZINC) tablet Take 1 tablet by mouth daily.     cetirizine (ZYRTEC) 10 MG tablet Take 10 mg by mouth daily.     Cholecalciferol (VITAMIN D3) 1000 units CAPS Take by mouth.     Collagen 500 MG CAPS Take by mouth.     ibuprofen (ADVIL,MOTRIN) 200 MG tablet Take by mouth.     Zinc Sulfate (ZINC 15 PO) Take by mouth.     No current  facility-administered medications for this visit.    Allergies  Allergen Reactions   Codeine Nausea And Vomiting    Pt does not want to take any additional codeine products.    Family History  Problem Relation Age of Onset   Breast cancer Cousin        maternal   Kidney cancer Father    Asthma Mother    Arthritis Sister    GER disease Sister    Diabetes Sister    Bone cancer Son    Kidney cancer Son     Social History   Socioeconomic History   Marital status: Widowed    Spouse name: Not on file   Number of children: Not on file   Years of education: Not on file   Highest education level: High school graduate  Occupational History   Occupation: retired  Tobacco Use   Smoking status: Every Day    Packs/day: 1.00    Years: 53.00    Total pack years: 53.00    Types: Cigarettes   Smokeless tobacco: Never   Tobacco comments:    she quit for 8 years in 1980s and would like to quit.  Meds not effective in past.  Vaping Use   Vaping Use: Never used  Substance and Sexual Activity   Alcohol use: Yes    Alcohol/week: 2.0 standard drinks of alcohol  Types: 2 Cans of beer per week    Comment: occasionally   Drug use: No   Sexual activity: Not on file  Other Topics Concern   Not on file  Social History Narrative   Pt lives alone   Social Determinants of Health   Financial Resource Strain: Low Risk  (02/04/2022)   Overall Financial Resource Strain (CARDIA)    Difficulty of Paying Living Expenses: Not hard at all  Food Insecurity: No Food Insecurity (02/04/2022)   Hunger Vital Sign    Worried About Running Out of Food in the Last Year: Never true    Ran Out of Food in the Last Year: Never true  Transportation Needs: No Transportation Needs (02/04/2022)   PRAPARE - Hydrologist (Medical): No    Lack of Transportation (Non-Medical): No  Physical Activity: Inactive (02/04/2022)   Exercise Vital Sign    Days of Exercise per Week: 0 days     Minutes of Exercise per Session: 0 min  Stress: No Stress Concern Present (02/04/2022)   [place]    Feeling of Stress : Not at all  Social Connections: Socially Isolated (02/04/2022)   Social Connection and Isolation Panel [NHANES]    Frequency of Communication with Friends and Family: More than three times a week    Frequency of Social Gatherings with Friends and Family: More than three times a week    Attends Religious Services: Never    Marine scientist or Organizations: No    Attends Archivist Meetings: Never    Marital Status: Widowed  Intimate Partner Violence: Not At Risk (02/04/2022)   Humiliation, Afraid, Rape, and Kick questionnaire    Fear of Current or Ex-Partner: No    Emotionally Abused: No    Physically Abused: No    Sexually Abused: No     Constitutional: Denies fever, malaise, fatigue, headache or abrupt weight changes.  HEENT: Denies eye pain, eye redness, ear pain, ringing in the ears, wax buildup, runny nose, nasal congestion, bloody nose, or sore throat. Respiratory: Patient reports chronic cough.  Denies difficulty breathing, shortness of breath, or sputum production.   Cardiovascular: Denies chest pain, chest tightness, palpitations or swelling in the hands or feet.  Gastrointestinal: Denies abdominal pain, bloating, constipation, diarrhea or blood in the stool.  GU: Patient reports stress incontinence.  Denies urgency, frequency, pain with urination, burning sensation, blood in urine, odor or discharge. Musculoskeletal: Denies decrease in range of motion, difficulty with gait, muscle pain or joint pain and swelling.  Skin: Denies redness, rashes, lesions or ulcercations.  Neurological: Denies dizziness, difficulty with memory, difficulty with speech or problems with balance and coordination.  Psych: Denies anxiety, depression, SI/HI.  No other specific complaints in a complete  review of systems (except as listed in HPI above).     Objective:   Physical Exam  BP 136/78 (BP Location: Left Arm, Patient Position: Sitting, Cuff Size: Normal)   Pulse 73   Temp (!) 96.9 F (36.1 C) (Temporal)   Ht _0  (1.702 m)   Wt 154 lb (69.9 kg)   SpO2 97%   BMI 24.12 kg/m   Wt Readings from Last 3 Encounters:  04/01/21 159 lb 6.4 oz (72.3 kg)  02/02/21 170 lb (77.1 kg)  09/09/20 171 lb (77.6 kg)    General: Appears her stated age, well developed, well nourished in NAD. Skin: Warm, dry and intact. HEENT: Head:  normal shape and size; Eyes: sclera white, no icterus, conjunctiva pink, PERRLA and EOMs intact;  Neck:  Neck supple, trachea midline. No masses, lumps or thyromegaly present.  Cardiovascular: Normal rate and rhythm. S1,S2 noted.  No murmur, rubs or gallops noted. No JVD or BLE edema. No carotid bruits noted. Pulmonary/Chest: Normal effort and positive vesicular breath sounds. No respiratory distress. No wheezes, rales or ronchi noted.  Abdomen: Soft and nontender. Normal bowel sounds.  Musculoskeletal: Strength 5/5 BUE/BLE.  No difficulty with gait.  Neurological: Alert and oriented. Cranial nerves II-XII grossly intact. Coordination normal.  Psychiatric: Mood and affect normal. Behavior is normal. Judgment and thought content normal.    BMET    Component Value Date/Time   NA 141 03/30/2020 0850   NA 140 01/09/2015 1123   K 4.7 03/30/2020 0850   K 3.9 01/09/2015 1123   CL 106 03/30/2020 0850   CL 105 01/09/2015 1123   CO2 27 03/30/2020 0850   CO2 28 01/09/2015 1123   GLUCOSE 102 (H) 03/30/2020 0850   GLUCOSE 100 (H) 01/09/2015 1123   BUN 18 03/30/2020 0850   BUN 12 01/09/2015 1123   CREATININE 0.81 03/30/2020 0850   CALCIUM 9.6 03/30/2020 0850   CALCIUM 9.4 01/09/2015 1123   GFRNONAA 70 03/30/2020 0850   GFRAA 81 03/30/2020 0850    Lipid Panel     Component Value Date/Time   CHOL 158 03/30/2020 0850   TRIG 258 (H) 03/30/2020 0850   HDL 37  (L) 03/30/2020 0850   CHOLHDL 4.3 03/30/2020 0850   LDLCALC 87 03/30/2020 0850    CBC    Component Value Date/Time   WBC 6.9 03/30/2020 0850   RBC 4.54 03/30/2020 0850   HGB 15.0 03/30/2020 0850   HGB 14.7 01/09/2015 1123   HCT 45.6 (H) 03/30/2020 0850   HCT 43.0 01/09/2015 1123   PLT 137 (L) 03/30/2020 0850   PLT 128 (L) 01/09/2015 1123   MCV 100.4 (H) 03/30/2020 0850   MCV 97 01/09/2015 1123   MCH 33.0 03/30/2020 0850   MCHC 32.9 03/30/2020 0850   RDW 12.7 03/30/2020 0850   RDW 12.4 01/09/2015 1123   LYMPHSABS 2,153 03/30/2020 0850   LYMPHSABS 2.2 01/09/2015 1123   MONOABS 0.5 01/09/2015 1123   EOSABS 262 03/30/2020 0850   EOSABS 0.3 01/09/2015 1123   BASOSABS 28 03/30/2020 0850   BASOSABS 0.0 01/09/2015 1123   BASOSABS 1 01/09/2015 1123    Hgb A1C Lab Results  Component Value Date   HGBA1C 5.1 03/30/2020            Assessment & Plan:   Preventative Health Maintenance:  Encouraged her to get a flu shot in the fall She declines tetanus for financial reasons, advised her if she gets bit or cut to go have this done Encouraged her to get her COVID-vaccine Pneumovax and Prevnar UTD Shingrix UTD She no longer needs Pap smears She no longer wants to screen for breast cancer She declines osteoporosis screening She no longer wants to screen for colon cancer She no longer wants to screen for lung cancer Encouraged her to consume a balanced diet and exercise regimen Advised her to see an eye doctor and dentist annually We will check CBC, c-Met, lipid and hep C today  RTC in 6 months, follow-up chronic conditions Webb Silversmith, NP

## 2022-04-05 ENCOUNTER — Telehealth: Payer: Self-pay

## 2022-04-05 LAB — COMPLETE METABOLIC PANEL WITH GFR
AG Ratio: 1.7 (calc) (ref 1.0–2.5)
ALT: 30 U/L — ABNORMAL HIGH (ref 6–29)
AST: 24 U/L (ref 10–35)
Albumin: 4.1 g/dL (ref 3.6–5.1)
Alkaline phosphatase (APISO): 48 U/L (ref 37–153)
BUN: 11 mg/dL (ref 7–25)
CO2: 25 mmol/L (ref 20–32)
Calcium: 8.9 mg/dL (ref 8.6–10.4)
Chloride: 109 mmol/L (ref 98–110)
Creat: 0.73 mg/dL (ref 0.60–1.00)
Globulin: 2.4 g/dL (calc) (ref 1.9–3.7)
Glucose, Bld: 108 mg/dL — ABNORMAL HIGH (ref 65–99)
Potassium: 4 mmol/L (ref 3.5–5.3)
Sodium: 143 mmol/L (ref 135–146)
Total Bilirubin: 0.5 mg/dL (ref 0.2–1.2)
Total Protein: 6.5 g/dL (ref 6.1–8.1)
eGFR: 84 mL/min/{1.73_m2} (ref >=60)

## 2022-04-05 LAB — CBC
HCT: 40.8 % (ref 35.0–45.0)
Hemoglobin: 13.6 g/dL (ref 11.7–15.5)
MCH: 33.4 pg — ABNORMAL HIGH (ref 27.0–33.0)
MCHC: 33.3 g/dL (ref 32.0–36.0)
MCV: 100.2 fL — ABNORMAL HIGH (ref 80.0–100.0)
MPV: 11.5 fL (ref 7.5–12.5)
Platelets: 128 10*3/uL — ABNORMAL LOW (ref 140–400)
RBC: 4.07 10*6/uL (ref 3.80–5.10)
RDW: 11.9 % (ref 11.0–15.0)
WBC: 5.9 10*3/uL (ref 3.8–10.8)

## 2022-04-05 LAB — LIPID PANEL
Cholesterol: 143 mg/dL (ref <200)
HDL: 38 mg/dL — ABNORMAL LOW (ref >=50)
LDL Cholesterol (Calc): 62 mg/dL (calc)
Non-HDL Cholesterol (Calc): 105 mg/dL (calc) (ref <130)
Total CHOL/HDL Ratio: 3.8 (calc) (ref <5.0)
Triglycerides: 358 mg/dL — ABNORMAL HIGH (ref <150)

## 2022-04-05 LAB — HEPATITIS C ANTIBODY: Hepatitis C Ab: NONREACTIVE

## 2022-04-05 NOTE — Telephone Encounter (Signed)
Opened in error

## 2022-09-04 ENCOUNTER — Other Ambulatory Visit: Payer: Self-pay

## 2022-09-04 DIAGNOSIS — I509 Heart failure, unspecified: Secondary | ICD-10-CM | POA: Insufficient documentation

## 2022-09-04 DIAGNOSIS — J449 Chronic obstructive pulmonary disease, unspecified: Secondary | ICD-10-CM | POA: Diagnosis not present

## 2022-09-04 DIAGNOSIS — Z7982 Long term (current) use of aspirin: Secondary | ICD-10-CM | POA: Diagnosis not present

## 2022-09-04 DIAGNOSIS — R531 Weakness: Secondary | ICD-10-CM | POA: Insufficient documentation

## 2022-09-04 DIAGNOSIS — Z1152 Encounter for screening for COVID-19: Secondary | ICD-10-CM | POA: Diagnosis not present

## 2022-09-04 DIAGNOSIS — N390 Urinary tract infection, site not specified: Secondary | ICD-10-CM | POA: Insufficient documentation

## 2022-09-04 DIAGNOSIS — N179 Acute kidney failure, unspecified: Secondary | ICD-10-CM | POA: Diagnosis not present

## 2022-09-04 LAB — CBC
HCT: 44.3 % (ref 36.0–46.0)
Hemoglobin: 15 g/dL (ref 12.0–15.0)
MCH: 32.5 pg (ref 26.0–34.0)
MCHC: 33.9 g/dL (ref 30.0–36.0)
MCV: 95.9 fL (ref 80.0–100.0)
Platelets: 171 10*3/uL (ref 150–400)
RBC: 4.62 MIL/uL (ref 3.87–5.11)
RDW: 11.9 % (ref 11.5–15.5)
WBC: 7.6 10*3/uL (ref 4.0–10.5)
nRBC: 0 % (ref 0.0–0.2)

## 2022-09-04 LAB — BASIC METABOLIC PANEL
Anion gap: 11 (ref 5–15)
BUN: 90 mg/dL — ABNORMAL HIGH (ref 8–23)
CO2: 20 mmol/L — ABNORMAL LOW (ref 22–32)
Calcium: 10.7 mg/dL — ABNORMAL HIGH (ref 8.9–10.3)
Chloride: 108 mmol/L (ref 98–111)
Creatinine, Ser: 1.71 mg/dL — ABNORMAL HIGH (ref 0.44–1.00)
GFR, Estimated: 30 mL/min — ABNORMAL LOW (ref >60)
Glucose, Bld: 116 mg/dL — ABNORMAL HIGH (ref 70–99)
Potassium: 4.4 mmol/L (ref 3.5–5.1)
Sodium: 139 mmol/L (ref 135–145)

## 2022-09-04 LAB — TROPONIN I (HIGH SENSITIVITY): Troponin I (High Sensitivity): 9 ng/L (ref <18)

## 2022-09-04 NOTE — ED Triage Notes (Addendum)
Pt to ED from home. Pt advised "I am so weak I can hardly walk". Pt had three teeth removed on 12/5 and hasn't been able to eat anything. Pt is CAOx4 and in no acute distress. Pt has been eating apple sauce and snacks as well with no complications. Pt has also been eating protein shakes, but still feels weaker than normal.

## 2022-09-05 ENCOUNTER — Emergency Department
Admission: EM | Admit: 2022-09-05 | Discharge: 2022-09-05 | Disposition: A | Discharge status: Home / Self Care | Payer: Medicare PPO | Attending: Emergency Medicine | Admitting: Emergency Medicine

## 2022-09-05 DIAGNOSIS — R531 Weakness: Secondary | ICD-10-CM

## 2022-09-05 DIAGNOSIS — N390 Urinary tract infection, site not specified: Secondary | ICD-10-CM

## 2022-09-05 DIAGNOSIS — N179 Acute kidney failure, unspecified: Secondary | ICD-10-CM

## 2022-09-05 LAB — URINALYSIS, ROUTINE W REFLEX MICROSCOPIC
Bilirubin Urine: NEGATIVE
Glucose, UA: NEGATIVE mg/dL
Hgb urine dipstick: NEGATIVE
Ketones, ur: NEGATIVE mg/dL
Nitrite: POSITIVE — AB
Protein, ur: NEGATIVE mg/dL
Specific Gravity, Urine: 1.013 (ref 1.005–1.030)
pH: 5 (ref 5.0–8.0)

## 2022-09-05 LAB — TROPONIN I (HIGH SENSITIVITY): Troponin I (High Sensitivity): 10 ng/L (ref <18)

## 2022-09-05 LAB — HEPATIC FUNCTION PANEL
ALT: 44 U/L (ref 0–44)
AST: 40 U/L (ref 15–41)
Albumin: 4.9 g/dL (ref 3.5–5.0)
Alkaline Phosphatase: 62 U/L (ref 38–126)
Bilirubin, Direct: <0.1 mg/dL (ref 0.0–0.2)
Total Bilirubin: 1 mg/dL (ref 0.3–1.2)
Total Protein: 8.6 g/dL — ABNORMAL HIGH (ref 6.5–8.1)

## 2022-09-05 LAB — RESP PANEL BY RT-PCR (RSV, FLU A&B, COVID)  RVPGX2
Influenza A by PCR: NEGATIVE
Influenza B by PCR: NEGATIVE
Resp Syncytial Virus by PCR: NEGATIVE
SARS Coronavirus 2 by RT PCR: NEGATIVE

## 2022-09-05 MED ORDER — CEFTRIAXONE SODIUM 1 G IJ SOLR
1.0000 g | Freq: Once | INTRAMUSCULAR | Status: AC
Start: 2022-09-05 — End: 2022-09-05
  Administered 2022-09-05: 1 g via INTRAVENOUS
  Filled 2022-09-05: qty 10

## 2022-09-05 MED ORDER — SODIUM CHLORIDE 0.9 % IV BOLUS (SEPSIS)
1000.0000 mL | Freq: Once | INTRAVENOUS | Status: AC
  Administered 2022-09-05: 1000 mL via INTRAVENOUS

## 2022-09-05 MED ORDER — CEPHALEXIN 500 MG PO CAPS
500.0000 mg | ORAL_CAPSULE | Freq: Two times a day (BID) | ORAL | 0 refills | Status: DC
Start: 2022-09-05 — End: 2022-09-09

## 2022-09-05 NOTE — ED Notes (Signed)
Pt was able to ambulate to bathroom in waiting area and provide specimen.

## 2022-09-05 NOTE — ED Provider Notes (Signed)
[facility] Provider Note    Event Date/Time   First MD Initiated Contact with Patient 09/05/22 [phone]     (approximate)   History   Weakness   HPI  Kristine Le is a 81 y.o. female with history of COPD, CHF who presents to the emergency department with complaints of generalized weakness and unable to eat solid foods after having 3 teeth extracted recently.  No chest pain, shortness of breath, vomiting, diarrhea.  No focal weakness but just feels weak all over.  No urinary symptoms.   History provided by patient and son.    Past Medical History:  Diagnosis Date   Allergy    CHF (congestive heart failure) (HCC)    Colon polyps    COPD (chronic obstructive pulmonary disease) (HCC)    PONV (postoperative nausea and vomiting)     Past Surgical History:  Procedure Laterality Date   CATARACT EXTRACTION W/PHACO Left 09/09/2020   Procedure: CATARACT EXTRACTION PHACO AND INTRAOCULAR LENS PLACEMENT (IOC) LEFT 8.52 01:05.1 13.1%;  Surgeon: Leandrew Koyanagi, MD;  Location: [place];  Service: Ophthalmology;  Laterality: Left;   KNEE SURGERY     PARTIAL HYSTERECTOMY     THUMB ARTHROSCOPY      MEDICATIONS:  Prior to Admission medications   Medication Sig Start Date End Date Taking? Authorizing Provider  Ascorbic Acid (VITAMIN C) 1000 MG tablet Take 1,000 mg by mouth daily.    [provider]  aspirin EC 81 MG tablet Take 1 tablet (81 mg total) by mouth daily. Swallow whole. 04/04/22   Jearld Fenton, NP  B Complex-C-E-Zn (B COMPLEX-C-E-ZINC) tablet Take 1 tablet by mouth daily.    [provider]  cetirizine (ZYRTEC) 10 MG tablet Take 10 mg by mouth daily.    [provider]  Cholecalciferol (VITAMIN D3) 1000 units CAPS Take by mouth.    [provider]  Collagen 500 MG CAPS Take by mouth.    [provider]  ibuprofen (ADVIL,MOTRIN) 200 MG tablet Take by mouth.    [provider]  Zinc  Sulfate (ZINC 15 PO) Take by mouth.    [provider]    Physical Exam   Triage Vital Signs: ED Triage Vitals  Enc Vitals Group     BP 09/04/22 2011 119/84     Pulse Rate 09/04/22 2011 94     Resp 09/04/22 2011 20     Temp 09/04/22 2011 98.5 F (36.9 C)     Temp Source 09/04/22 2011 Oral     SpO2 09/04/22 2011 95 %     Weight 09/04/22 2011 140 lb (63.5 kg)     Height 09/04/22 2011 5\' 7"  (1.702 m)     Head Circumference --      Peak Flow --      Pain Score 09/04/22 2016 0     Pain Loc --      Pain Edu? --      Excl. in [place]? --     Most recent vital signs: Vitals:   09/04/22 2011 09/04/22 2341  BP: 119/84 133/73  Pulse: 94 92  Resp: 20 16  Temp: 98.5 F (36.9 C) 98.4 F (36.9 C)  SpO2: 95% 95%    CONSTITUTIONAL: Alert and oriented and responds appropriately to questions.  Elderly, chronically ill-appearing HEAD: Normocephalic, atraumatic EYES: Conjunctivae clear, pupils appear equal, sclera nonicteric ENT: normal nose; moist mucous membranes NECK: Supple, normal ROM CARD: RRR; S1 and S2 appreciated; no  murmurs, no clicks, no rubs, no gallops RESP: Normal chest excursion without splinting or tachypnea; breath sounds clear and equal bilaterally; no wheezes, no rhonchi, no rales, no hypoxia or respiratory distress, speaking full sentences ABD/GI: Normal bowel sounds; non-distended; soft, non-tender, no rebound, no guarding, no peritoneal signs BACK: The back appears normal EXT: Normal ROM in all joints; no deformity noted, no edema; no cyanosis SKIN: Normal color for age and race; warm; no rash on exposed skin NEURO: Moves all extremities equally, normal speech, ambulates without difficulty, normal sensation, no facial asymmetry PSYCH: The patient's mood and manner are appropriate.   ED Results / Procedures / Treatments   LABS: (all labs ordered are listed, but only abnormal results are displayed) Labs Reviewed  BASIC METABOLIC PANEL - Abnormal; Notable  for the following components:      Result Value   CO2 20 (*)    Glucose, Bld 116 (*)    BUN 90 (*)    Creatinine, Ser 1.71 (*)    Calcium 10.7 (*)    GFR, Estimated 30 (*)    All other components within normal limits  URINALYSIS, ROUTINE W REFLEX MICROSCOPIC - Abnormal; Notable for the following components:   Color, Urine YELLOW (*)    APPearance HAZY (*)    Nitrite POSITIVE (*)    Leukocytes,Ua SMALL (*)    Bacteria, UA MANY (*)    All other components within normal limits  HEPATIC FUNCTION PANEL - Abnormal; Notable for the following components:   Total Protein 8.6 (*)    All other components within normal limits  RESP PANEL BY RT-PCR (RSV, FLU A&B, COVID)  [id number]  URINE CULTURE  CBC  TROPONIN I (HIGH SENSITIVITY)  TROPONIN I (HIGH SENSITIVITY)     EKG:  EKG Interpretation  Date/Time:  Sunday September 04 2022 20:14:36 EST Ventricular Rate:  95 PR Interval:  158 QRS Duration: 96 QT Interval:  356 QTC Calculation: 447 R Axis:   53 Text Interpretation: Normal sinus rhythm Cannot rule out Anterior infarct , age undetermined Abnormal ECG No previous ECGs available Confirmed by Pryor Curia [id number]) on 09/05/2022 1:22:14 AM         RADIOLOGY: My personal review and interpretation of imaging:    I have personally reviewed all radiology reports.   No results found.   PROCEDURES:  Critical Care performed: No      Procedures    IMPRESSION / MDM / ASSESSMENT AND PLAN / ED COURSE  I reviewed the triage vital signs and the nursing notes.    Patient here with generalized weakness, decreased ability to take in p.o.    DIFFERENTIAL DIAGNOSIS (includes but not limited to):   Dehydration, electrolyte derangement, AKI, UTI, anemia, less likely ACS, PE, stroke   Patient's presentation is most consistent with acute presentation with potential threat to life or bodily function.   PLAN: Workup initiated from triage.  Patient has an AKI.  Creatinine is normally  around 0.7 and today is 1.7 with a BUN of 90.  Will give IV fluids but monitor closely due to history of CHF.  Normal albumin.  Normal electrolytes other than mild decrease in bicarb likely from uremia.  Normal hemoglobin.  Troponin x 2 negative.  COVID, flu and RSV negative.  Patient does have a nitrite positive UTI.  Will add on a urine culture and give Rocephin.  Have recommended admission to the hospital but patient states she has a small dog at home and would like to see  how she feels after IV medications and reassess.   MEDICATIONS GIVEN IN ED: Medications  sodium chloride 0.9 % bolus 1,000 mL (1,000 mLs Intravenous New Bag/Given 09/05/22 0058)  cefTRIAXone (ROCEPHIN) 1 g in sodium chloride 0.9 % 100 mL IVPB (1 g Intravenous New Bag/Given 09/05/22 0059)     ED COURSE: Patient remained stable after antibiotics and fluids.  Able to tolerate p.o. I have offered admission again but patient declined stating she would like to go home.  Recommended close follow-up with her PCP in 1 week to have blood work rechecked.  Discussed return precautions.  Patient and son comfortable with this plan.   At this time, I do not feel there is any life-threatening condition present. I reviewed all nursing notes, vitals, pertinent previous records.  All lab and urine results, EKGs, imaging ordered have been independently reviewed and interpreted by myself.  I reviewed all available radiology reports from any imaging ordered this visit.  Based on my assessment, I feel the patient is safe to be discharged home without further emergent workup and can continue workup as an outpatient as needed. Discussed all findings, treatment plan as well as usual and customary return precautions.  They verbalize understanding and are comfortable with this plan.  Outpatient follow-up has been provided as needed.  All questions have been answered.    CONSULTS: Admission offered and recommended but patient declined.   OUTSIDE RECORDS  REVIEWED: Reviewed last family medicine note in September 2018.       FINAL CLINICAL IMPRESSION(S) / ED DIAGNOSES   Final diagnoses:  Generalized weakness  AKI (acute kidney injury) (HCC)  Acute UTI     Rx / DC Orders   ED Discharge Orders          Ordered    cephALEXin (KEFLEX) 500 MG capsule  2 times daily        09/05/22 0148             Note:  This document was prepared using Dragon voice recognition software and may include unintentional dictation errors.   Carianne Taira, Layla Maw, DO 09/05/22 [phone]

## 2022-09-07 LAB — URINE CULTURE: Culture: >=100000 — AB

## 2022-09-08 NOTE — Progress Notes (Signed)
ED Antimicrobial Stewardship Positive Culture Follow Up   Kristine Le is an 81 y.o. female who presented to [facility] on 09/05/2022 with a chief complaint of  Chief Complaint  Patient presents with   Weakness    Recent Results (from the past 720 hour(s))  Resp panel by RT-PCR (RSV, Flu A&B, Covid) Anterior Nasal Swab     Status: None   Collection Time: 09/05/22 12:05 AM   Specimen: Anterior Nasal Swab  Result Value Ref Range Status   SARS Coronavirus 2 by RT PCR NEGATIVE NEGATIVE Final    Comment: (NOTE) SARS-CoV-2 target nucleic acids are NOT DETECTED.  The SARS-CoV-2 RNA is generally detectable in upper respiratory specimens during the acute phase of infection. The lowest concentration of SARS-CoV-2 viral copies this assay can detect is 138 copies/mL. A negative result does not preclude SARS-Cov-2 infection and should not be used as the sole basis for treatment or other patient management decisions. A negative result may occur with  improper specimen collection/handling, submission of specimen other than nasopharyngeal swab, presence of viral mutation(s) within the areas targeted by this assay, and inadequate number of viral copies(<138 copies/mL). A negative result must be combined with clinical observations, patient history, and epidemiological information. The expected result is Negative.  Fact Sheet for Patients:  BloggerCourse.com  Fact Sheet for Healthcare Providers:  SeriousBroker.it  This test is no t yet approved or cleared by the [place] FDA and  has been authorized for detection and/or diagnosis of SARS-CoV-2 by FDA under an Emergency Use Authorization (EUA). This EUA will remain  in effect (meaning this test can be used) for the duration of the COVID-19 declaration under Section 564(b)(1) of the Act, 21 U.S.C.section 360bbb-3(b)(1), unless the authorization is terminated  or revoked sooner.        Influenza A by PCR NEGATIVE NEGATIVE Final   Influenza B by PCR NEGATIVE NEGATIVE Final    Comment: (NOTE) The Xpert Xpress SARS-CoV-2/FLU/RSV plus assay is intended as an aid in the diagnosis of influenza from Nasopharyngeal swab specimens and should not be used as a sole basis for treatment. Nasal washings and aspirates are unacceptable for Xpert Xpress SARS-CoV-2/FLU/RSV testing.  Fact Sheet for Patients: BloggerCourse.com  Fact Sheet for Healthcare Providers: SeriousBroker.it  This test is not yet approved or cleared by the [place] FDA and has been authorized for detection and/or diagnosis of SARS-CoV-2 by FDA under an Emergency Use Authorization (EUA). This EUA will remain in effect (meaning this test can be used) for the duration of the COVID-19 declaration under Section 564(b)(1) of the Act, 21 U.S.C. section 360bbb-3(b)(1), unless the authorization is terminated or revoked.     Resp Syncytial Virus by PCR NEGATIVE NEGATIVE Final    Comment: (NOTE) Fact Sheet for Patients: BloggerCourse.com  Fact Sheet for Healthcare Providers: SeriousBroker.it  This test is not yet approved or cleared by the [place] FDA and has been authorized for detection and/or diagnosis of SARS-CoV-2 by FDA under an Emergency Use Authorization (EUA). This EUA will remain in effect (meaning this test can be used) for the duration of the COVID-19 declaration under Section 564(b)(1) of the Act, 21 U.S.C. section 360bbb-3(b)(1), unless the authorization is terminated or revoked.  Performed at [facility], [address] [place], [place] [address]   Urine Culture     Status: Abnormal   Collection Time: 09/05/22 12:05 AM   Specimen: Urine, Clean Catch  Result Value Ref Range Status   Specimen Description  Final    URINE, CLEAN CATCH Performed at [facility], [address] [place], [place] [address]    Special Requests   Final    NONE Performed at [facility], [address] [place], [place] [address]    Culture >=100,000 COLONIES/mL ESCHERICHIA COLI (A)  Final   Report Status 09/07/2022 FINAL  Final   Organism ID, Bacteria ESCHERICHIA COLI (A)  Final      Susceptibility   Escherichia coli - MIC*    AMPICILLIN >=32 RESISTANT Resistant     CEFAZOLIN >=64 RESISTANT Resistant     CEFEPIME <=0.12 SENSITIVE Sensitive     CEFTRIAXONE <=0.25 SENSITIVE Sensitive     CIPROFLOXACIN >=4 RESISTANT Resistant     GENTAMICIN <=1 SENSITIVE Sensitive     IMIPENEM <=0.25 SENSITIVE Sensitive     NITROFURANTOIN <=16 SENSITIVE Sensitive     TRIMETH/SULFA <=20 SENSITIVE Sensitive     AMPICILLIN/SULBACTAM >=32 RESISTANT Resistant     PIP/TAZO >=128 RESISTANT Resistant     * >=100,000 COLONIES/mL ESCHERICHIA COLI    [x]  Treated with Cephalexin 500 mg po BID, organism resistant to prescribed antimicrobial   New antibiotic prescription: Bactrim DS 1 tablet po BID x 7 days  ED Provider: , MD  Patient notified and script called in to [facility] 09/08/22   09/10/22, PharmD Clinical Pharmacist  09/08/2022, 10:49 AM

## 2022-09-09 ENCOUNTER — Ambulatory Visit: Payer: Medicare PPO | Admitting: Internal Medicine

## 2022-09-09 ENCOUNTER — Encounter: Payer: Self-pay | Admitting: Internal Medicine

## 2022-09-09 VITALS — BP 112/60 | HR 80 | Temp 96.7°F | Wt 152.0 lb

## 2022-09-09 DIAGNOSIS — N3000 Acute cystitis without hematuria: Secondary | ICD-10-CM

## 2022-09-09 DIAGNOSIS — N179 Acute kidney failure, unspecified: Secondary | ICD-10-CM | POA: Diagnosis not present

## 2022-09-09 DIAGNOSIS — E860 Dehydration: Secondary | ICD-10-CM

## 2022-09-09 NOTE — Progress Notes (Signed)
Subjective:    Patient ID: Kristine Le, female    DOB: Feb 16, 1941, 81 y.o.   MRN: [id number]  HPI  Patient presents to clinic today for ER follow-up.  She presented to the ER 12/18 with complaint of inability to eat, weakness.  She tested negative for flu, COVID and RSV.  Labs showed an elevated creatinine of 1.71 with a GFR of 30.  Urinalysis was concerning for infection so she was given 1 g of Rocephin.  Urine was sent for culture which grew 100,000 colonies of E. coli.  She was also given a liter of fluids.  She was offered admission which she declined.  She was discharged with Rx for Keflex which was subsequently to Septra based off sensitivity.  Since that time, she reports she is eating better. She has more energy. She does not have any UTI symptoms.  Review of Systems     Past Medical History:  Diagnosis Date   Allergy    CHF (congestive heart failure) (HCC)    Colon polyps    COPD (chronic obstructive pulmonary disease) (HCC)    PONV (postoperative nausea and vomiting)     Current Outpatient Medications  Medication Sig Dispense Refill   Ascorbic Acid (VITAMIN C) 1000 MG tablet Take 1,000 mg by mouth daily.     aspirin EC 81 MG tablet Take 1 tablet (81 mg total) by mouth daily. Swallow whole. 30 tablet 12   B Complex-C-E-Zn (B COMPLEX-C-E-ZINC) tablet Take 1 tablet by mouth daily.     cephALEXin (KEFLEX) 500 MG capsule Take 1 capsule (500 mg total) by mouth 2 (two) times daily. 14 capsule 0   cetirizine (ZYRTEC) 10 MG tablet Take 10 mg by mouth daily.     Cholecalciferol (VITAMIN D3) 1000 units CAPS Take by mouth.     Collagen 500 MG CAPS Take by mouth.     ibuprofen (ADVIL,MOTRIN) 200 MG tablet Take by mouth.     Zinc Sulfate (ZINC 15 PO) Take by mouth.     No current facility-administered medications for this visit.    Allergies  Allergen Reactions   Codeine Nausea And Vomiting    Pt does not want to take any additional codeine products.    Family History   Problem Relation Age of Onset   Asthma Mother    Kidney cancer Father    Arthritis Sister    GER disease Sister    Diabetes Sister    Bone cancer Son    Kidney cancer Son     Social History   Socioeconomic History   Marital status: Widowed    Spouse name: Not on file   Number of children: Not on file   Years of education: Not on file   Highest education level: High school graduate  Occupational History   Occupation: retired  Tobacco Use   Smoking status: Every Day    Packs/day: 1.00    Years: 53.00    Total pack years: 53.00    Types: Cigarettes   Smokeless tobacco: Never   Tobacco comments:    she quit for 8 years in 1980s and would like to quit.  Meds not effective in past.  Vaping Use   Vaping Use: Never used  Substance and Sexual Activity   Alcohol use: Yes    Alcohol/week: 2.0 standard drinks of alcohol    Types: 2 Cans of beer per week    Comment: occasionally   Drug use: No   Sexual activity: Not on  file  Other Topics Concern   Not on file  Social History Narrative   Pt lives alone   Social Determinants of Health   Financial Resource Strain: Low Risk  (02/04/2022)   Overall Financial Resource Strain (CARDIA)    Difficulty of Paying Living Expenses: Not hard at all  Food Insecurity: No Food Insecurity (02/04/2022)   Hunger Vital Sign    Worried About Running Out of Food in the Last Year: Never true    Ran Out of Food in the Last Year: Never true  Transportation Needs: No Transportation Needs (02/04/2022)   PRAPARE - Administrator, Civil Service (Medical): No    Lack of Transportation (Non-Medical): No  Physical Activity: Inactive (02/04/2022)   Exercise Vital Sign    Days of Exercise per Week: 0 days    Minutes of Exercise per Session: 0 min  Stress: No Stress Concern Present (02/04/2022)   [employer] of Occupational Health - Occupational Stress Questionnaire    Feeling of Stress : Not at all  Social Connections: Socially Isolated  (02/04/2022)   Social Connection and Isolation Panel [NHANES]    Frequency of Communication with Friends and Family: More than three times a week    Frequency of Social Gatherings with Friends and Family: More than three times a week    Attends Religious Services: Never    Database administrator or Organizations: No    Attends Banker Meetings: Never    Marital Status: Widowed  Intimate Partner Violence: Not At Risk (02/04/2022)   Humiliation, Afraid, Rape, and Kick questionnaire    Fear of Current or Ex-Partner: No    Emotionally Abused: No    Physically Abused: No    Sexually Abused: No     Constitutional: Denies fever, malaise, fatigue, headache or abrupt weight changes.  Respiratory: Denies difficulty breathing, shortness of breath, cough or sputum production.   Cardiovascular: Denies chest pain, chest tightness, palpitations or swelling in the hands or feet.  Gastrointestinal: Denies abdominal pain, bloating, constipation, diarrhea or blood in the stool.  GU: Denies urgency, frequency, pain with urination, burning sensation, blood in urine, odor or discharge. Musculoskeletal: Denies decrease in range of motion, difficulty with gait, muscle pain or joint pain and swelling.   No other specific complaints in a complete review of systems (except as listed in HPI above).  Objective:   Physical Exam   BP 112/60 (BP Location: Left Arm, Patient Position: Sitting, Cuff Size: Normal)   Pulse 80   Temp (!) 96.7 F (35.9 C) (Temporal)   Wt 152 lb (68.9 kg)   BMI 23.81 kg/m   Wt Readings from Last 3 Encounters:  09/04/22 140 lb (63.5 kg)  04/04/22 154 lb (69.9 kg)  04/01/21 159 lb 6.4 oz (72.3 kg)    General: Appears her stated age, in NAD. Cardiovascular: Normal rate and rhythm. S1,S2 noted.  No murmur, rubs or gallops noted.  Pulmonary/Chest: Normal effort and positive vesicular breath sounds. No respiratory distress. No wheezes, rales or ronchi noted.  Abdomen:  Soft and nontender. Normal bowel sounds. No distention or masses noted. No CVA tenderness noted. Musculoskeletal: No difficulty with gait.  Neurological: Alert and oriented.     BMET    Component Value Date/Time   NA 139 09/04/2022 2017   NA 140 01/09/2015 1123   K 4.4 09/04/2022 2017   K 3.9 01/09/2015 1123   CL 108 09/04/2022 2017   CL 105 01/09/2015 1123  CO2 20 (L) 09/04/2022 2017   CO2 28 01/09/2015 1123   GLUCOSE 116 (H) 09/04/2022 2017   GLUCOSE 100 (H) 01/09/2015 1123   BUN 90 (H) 09/04/2022 2017   BUN 12 01/09/2015 1123   CREATININE 1.71 (H) 09/04/2022 2017   CREATININE 0.73 04/04/2022 0833   CALCIUM 10.7 (H) 09/04/2022 2017   CALCIUM 9.4 01/09/2015 1123   GFRNONAA 30 (L) 09/04/2022 2017   GFRNONAA 70 03/30/2020 0850   GFRAA 81 03/30/2020 0850    Lipid Panel     Component Value Date/Time   CHOL 143 04/04/2022 0833   TRIG 358 (H) 04/04/2022 0833   HDL 38 (L) 04/04/2022 0833   CHOLHDL 3.8 04/04/2022 0833   LDLCALC 62 04/04/2022 0833    CBC    Component Value Date/Time   WBC 7.6 09/04/2022 2017   RBC 4.62 09/04/2022 2017   HGB 15.0 09/04/2022 2017   HGB 14.7 01/09/2015 1123   HCT 44.3 09/04/2022 2017   HCT 43.0 01/09/2015 1123   PLT 171 09/04/2022 2017   PLT 128 (L) 01/09/2015 1123   MCV 95.9 09/04/2022 2017   MCV 97 01/09/2015 1123   MCH 32.5 09/04/2022 2017   MCHC 33.9 09/04/2022 2017   RDW 11.9 09/04/2022 2017   RDW 12.4 01/09/2015 1123   LYMPHSABS 2,153 03/30/2020 0850   LYMPHSABS 2.2 01/09/2015 1123   MONOABS 0.5 01/09/2015 1123   EOSABS 262 03/30/2020 0850   EOSABS 0.3 01/09/2015 1123   BASOSABS 28 03/30/2020 0850   BASOSABS 0.0 01/09/2015 1123   BASOSABS 1 01/09/2015 1123    Hgb A1C Lab Results  Component Value Date   HGBA1C 5.1 03/30/2020           Assessment & Plan:   ER follow-up for Acute Kidney Injury, Dehydration and UTI:  ER notes and labs reviewed Clinically she is improved Will have her continue Septra as  prescribed  We will repeat BMET today  RTC in 1 month for follow-up of chronic conditions Nicki Reaper, NP

## 2022-09-09 NOTE — Patient Instructions (Signed)
Dehydration, Adult Dehydration is condition in which there is not enough water or other fluids in the body. This happens when a person loses more fluids than he or she takes in. Important body parts cannot work right without the right amount of fluids. Any loss of fluids from the body can cause dehydration. Dehydration can be mild, worse, or very bad. It should be treated right away to keep it from getting very bad. What are the causes? This condition may be caused by: Conditions that cause loss of water or other fluids, such as: Watery poop (diarrhea). Vomiting. Sweating a lot. Peeing (urinating) a lot. Not drinking enough fluids, especially when you: Are ill. Are doing things that take a lot of energy to do. Other illnesses and conditions, such as fever or infection. Certain medicines, such as medicines that take extra fluid out of the body (diuretics). Lack of safe drinking water. Not being able to get enough water and food. What increases the risk? The following factors may make you more likely to develop this condition: Having a long-term (chronic) illness that has not been treated the right way, such as: Diabetes. Heart disease. Kidney disease. Being 65 years of age or older. Having a disability. Living in a place that is high above the ground or sea (high in altitude). The thinner, dried air causes more fluid loss. Doing exercises that put stress on your body for a long time. What are the signs or symptoms? Symptoms of dehydration depend on how bad it is. Mild or worse dehydration Thirst. Dry lips or dry mouth. Feeling dizzy or light-headed, especially when you stand up from sitting. Muscle cramps. Your body making: Dark pee (urine). Pee may be the color of tea. Less pee than normal. Less tears than normal. Headache. Very bad dehydration Changes in skin. Skin may: Be cold to the touch (clammy). Be blotchy or pale. Not go back to normal right after you lightly pinch  it and let it go. Little or no tears, pee, or sweat. Changes in vital signs, such as: Fast breathing. Low blood pressure. Weak pulse. Pulse that is more than 100 beats a minute when you are sitting still. Other changes, such as: Feeling very thirsty. Eyes that look hollow (sunken). Cold hands and feet. Being mixed up (confused). Being very tired (lethargic) or having trouble waking from sleep. Short-term weight loss. Loss of consciousness. How is this treated? Treatment for this condition depends on how bad it is. Treatment should start right away. Do not wait until your condition gets very bad. Very bad dehydration is an emergency. You will need to go to a hospital. Mild or worse dehydration can be treated at home. You may be asked to: Drink more fluids. Drink an oral rehydration solution (ORS). This drink helps get the right amounts of fluids and salts and minerals in the blood (electrolytes). Very bad dehydration can be treated: With fluids through an IV tube. By getting normal levels of salts and minerals in your blood. This is often done by giving salts and minerals through a tube. The tube is passed through your nose and into your stomach. By treating the root cause. Follow these instructions at home: Oral rehydration solution If told by your doctor, drink an ORS: Make an ORS. Use instructions on the package. Start by drinking small amounts, about  cup (120 mL) every 5-10 minutes. Slowly drink more until you have had the amount that your doctor said to have. Eating and drinking          Drink enough clear fluid to keep your pee pale yellow. If you were told to drink an ORS, finish the ORS first. Then, start slowly drinking other clear fluids. Drink fluids such as: Water. Do not drink only water. Doing that can make the salt (sodium) level in your body get too low. Water from ice chips you suck on. Fruit juice that you have added water to (diluted). Low-calorie sports  drinks. Eat foods that have the right amounts of salts and minerals, such as: Bananas. Oranges. Potatoes. Tomatoes. Spinach. Do not drink alcohol. Avoid: Drinks that have a lot of sugar. These include: High-calorie sports drinks. Fruit juice that you did not add water to. Soda. Caffeine. Foods that are greasy or have a lot of fat or sugar. General instructions Take over-the-counter and prescription medicines only as told by your doctor. Do not take salt tablets. Doing that can make the salt level in your body get too high. Return to your normal activities as told by your doctor. Ask your doctor what activities are safe for you. Keep all follow-up visits as told by your doctor. This is important. Contact a doctor if: You have pain in your belly (abdomen) and the pain: Gets worse. Stays in one place. You have a rash. You have a stiff neck. You get angry or annoyed (irritable) more easily than normal. You are more tired or have a harder time waking than normal. You feel: Weak or dizzy. Very thirsty. Get help right away if you have: Any symptoms of very bad dehydration. Symptoms of vomiting, such as: You cannot eat or drink without vomiting. Your vomiting gets worse or does not go away. Your vomit has blood or green stuff in it. Symptoms that get worse with treatment. A fever. A very bad headache. Problems with peeing or pooping (having a bowel movement), such as: Watery poop that gets worse or does not go away. Blood in your poop (stool). This may cause poop to look black and tarry. Not peeing in 6-8 hours. Peeing only a small amount of very dark pee in 6-8 hours. Trouble breathing. These symptoms may be an emergency. Do not wait to see if the symptoms will go away. Get medical help right away. Call your local emergency services (911 in the [place] Do not drive yourself to the hospital. Summary Dehydration is a condition in which there is not enough water or other fluids  in the body. This happens when a person loses more fluids than he or she takes in. Treatment for this condition depends on how bad it is. Treatment should be started right away. Do not wait until your condition gets very bad. Drink enough clear fluid to keep your pee pale yellow. If you were told to drink an oral rehydration solution (ORS), finish the ORS first. Then, start slowly drinking other clear fluids. Take over-the-counter and prescription medicines only as told by your doctor. Get help right away if you have any symptoms of very bad dehydration. This information is not intended to replace advice given to you by your health care provider. Make sure you discuss any questions you have with your health care provider. Document Revised: 01/12/2022 Document Reviewed: 04/18/2019 Elsevier Patient Education  2023 Elsevier Inc.  

## 2022-09-10 LAB — BASIC METABOLIC PANEL WITH GFR
BUN/Creatinine Ratio: 15 (calc) (ref 6–22)
BUN: 23 mg/dL (ref 7–25)
CO2: 23 mmol/L (ref 20–32)
Calcium: 9.3 mg/dL (ref 8.6–10.4)
Chloride: 110 mmol/L (ref 98–110)
Creat: 1.55 mg/dL — ABNORMAL HIGH (ref 0.60–1.00)
Glucose, Bld: 100 mg/dL — ABNORMAL HIGH (ref 65–99)
Potassium: 4 mmol/L (ref 3.5–5.3)
Sodium: 143 mmol/L (ref 135–146)
eGFR: 34 mL/min/{1.73_m2} — ABNORMAL LOW (ref >=60)

## 2022-09-13 ENCOUNTER — Telehealth: Payer: Self-pay

## 2022-09-13 NOTE — Telephone Encounter (Signed)
        Patient  visited [facility] on 12/18   Telephone encounter attempt :  1st  A HIPAA compliant voice message was left requesting a return call.  Instructed patient to call back .    Derreon Consalvo [facility] Guide, [place], Care Management  [phone] [address], [place], [place] [address] Phone: [phone] Email: [email or web address]       

## 2022-09-14 ENCOUNTER — Telehealth: Payer: Self-pay

## 2022-09-14 NOTE — Telephone Encounter (Signed)
     Patient  visit [place]  on 12/18  Have you been able to follow up with your primary care physician? Yes   The patient was or was not able to obtain any needed medicine or equipment. Yes   Are there diet recommendations that you are having difficulty following? NA   Patient expresses understanding of discharge instructions and education provided has no other needs at this time.  Yes     Lenard Forth [facility] Guide, [facility], Care Management  [phone] [address] [place], [place], [place] [address] Phone: [phone] Email: [email or web address]

## 2022-10-10 ENCOUNTER — Encounter: Payer: Self-pay | Admitting: Internal Medicine

## 2022-10-10 ENCOUNTER — Ambulatory Visit: Payer: Medicare PPO | Admitting: Internal Medicine

## 2022-10-10 VITALS — BP 132/80 | HR 68 | Temp 96.6°F | Wt 156.0 lb

## 2022-10-10 DIAGNOSIS — J410 Simple chronic bronchitis: Secondary | ICD-10-CM

## 2022-10-10 DIAGNOSIS — E781 Pure hyperglyceridemia: Secondary | ICD-10-CM

## 2022-10-10 DIAGNOSIS — N1832 Chronic kidney disease, stage 3b: Secondary | ICD-10-CM

## 2022-10-10 DIAGNOSIS — I700 Atherosclerosis of aorta: Secondary | ICD-10-CM | POA: Diagnosis not present

## 2022-10-10 DIAGNOSIS — N183 Chronic kidney disease, stage 3 unspecified: Secondary | ICD-10-CM | POA: Insufficient documentation

## 2022-10-10 NOTE — Progress Notes (Signed)
Subjective:    Patient ID: Kristine Le, female    DOB: 1940-12-04, 82 y.o.   MRN: [id number]  HPI  Patient presents to clinic today for 38-month follow-up of chronic conditions.  COPD: She reports chronic cough but denies shortness of breath.  She is not using any inhalers at this time.  There are no PFTs on file.  HLD with Aortic Atherosclerosis: Her last LDL was 62, triglycerides 358, 03/2022.  She is not taking any cholesterol-lowering medication at this time.  She is not taking Aspirin.  She does not consume a low-fat diet.  Stress incontinence: She manages with the use of pads.  She does not follow with urology.  CKD 3: Her last creatinine was 1.55, GFR 30, 08/2022.  She is not currently on an ACEI/ARB.  She does not follow with nephrology.  Review of Systems     Past Medical History:  Diagnosis Date   Allergy    CHF (congestive heart failure) (HCC)    Colon polyps    COPD (chronic obstructive pulmonary disease) (HCC)    PONV (postoperative nausea and vomiting)     Current Outpatient Medications  Medication Sig Dispense Refill   Ascorbic Acid (VITAMIN C) 1000 MG tablet Take 1,000 mg by mouth daily.     aspirin EC 81 MG tablet Take 1 tablet (81 mg total) by mouth daily. Swallow whole. 30 tablet 12   B Complex-C-E-Zn (B COMPLEX-C-E-ZINC) tablet Take 1 tablet by mouth daily.     cetirizine (ZYRTEC) 10 MG tablet Take 10 mg by mouth daily.     Cholecalciferol (VITAMIN D3) 1000 units CAPS Take by mouth.     Collagen 500 MG CAPS Take by mouth.     ibuprofen (ADVIL,MOTRIN) 200 MG tablet Take by mouth.     sulfamethoxazole-trimethoprim (BACTRIM DS) 800-160 MG tablet Take 1 tablet by mouth 2 (two) times daily.     Zinc Sulfate (ZINC 15 PO) Take by mouth.     No current facility-administered medications for this visit.    Allergies  Allergen Reactions   Codeine Nausea And Vomiting    Pt does not want to take any additional codeine products.    Family History  Problem  Relation Age of Onset   Asthma Mother    Kidney cancer Father    Arthritis Sister    GER disease Sister    Diabetes Sister    Bone cancer Son    Kidney cancer Son     Social History   Socioeconomic History   Marital status: Widowed    Spouse name: Not on file   Number of children: Not on file   Years of education: Not on file   Highest education level: High school graduate  Occupational History   Occupation: retired  Tobacco Use   Smoking status: Every Day    Packs/day: 1.00    Years: 53.00    Total pack years: 53.00    Types: Cigarettes   Smokeless tobacco: Never   Tobacco comments:    she quit for 8 years in 1980s and would like to quit.  Meds not effective in past.  Vaping Use   Vaping Use: Never used  Substance and Sexual Activity   Alcohol use: Yes    Alcohol/week: 2.0 standard drinks of alcohol    Types: 2 Cans of beer per week    Comment: occasionally   Drug use: No   Sexual activity: Not on file  Other Topics Concern   Not  on file  Social History Narrative   Pt lives alone   Social Determinants of Health   Financial Resource Strain: Low Risk  (02/04/2022)   Overall Financial Resource Strain (CARDIA)    Difficulty of Paying Living Expenses: Not hard at all  Food Insecurity: No Food Insecurity (02/04/2022)   Hunger Vital Sign    Worried About Running Out of Food in the Last Year: Never true    Ran Out of Food in the Last Year: Never true  Transportation Needs: No Transportation Needs (02/04/2022)   PRAPARE - Administrator, Civil Service (Medical): No    Lack of Transportation (Non-Medical): No  Physical Activity: Inactive (02/04/2022)   Exercise Vital Sign    Days of Exercise per Week: 0 days    Minutes of Exercise per Session: 0 min  Stress: No Stress Concern Present (02/04/2022)   [employer] of Occupational Health - Occupational Stress Questionnaire    Feeling of Stress : Not at all  Social Connections: Socially Isolated (02/04/2022)    Social Connection and Isolation Panel [NHANES]    Frequency of Communication with Friends and Family: More than three times a week    Frequency of Social Gatherings with Friends and Family: More than three times a week    Attends Religious Services: Never    Database administrator or Organizations: No    Attends Banker Meetings: Never    Marital Status: Widowed  Intimate Partner Violence: Not At Risk (02/04/2022)   Humiliation, Afraid, Rape, and Kick questionnaire    Fear of Current or Ex-Partner: No    Emotionally Abused: No    Physically Abused: No    Sexually Abused: No     Constitutional: Denies fever, malaise, fatigue, headache or abrupt weight changes.  HEENT: Denies eye pain, eye redness, ear pain, ringing in the ears, wax buildup, runny nose, nasal congestion, bloody nose, or sore throat. Respiratory: Denies difficulty breathing, shortness of breath, cough or sputum production.   Cardiovascular: Denies chest pain, chest tightness, palpitations or swelling in the hands or feet.  Gastrointestinal: Denies abdominal pain, bloating, constipation, diarrhea or blood in the stool.  GU: Patient reports stress incontinence.  Denies urgency, frequency, pain with urination, burning sensation, blood in urine, odor or discharge. Musculoskeletal: Denies decrease in range of motion, difficulty with gait, muscle pain or joint pain and swelling.  Skin: Denies redness, rashes, lesions or ulcercations.  Neurological: Denies dizziness, difficulty with memory, difficulty with speech or problems with balance and coordination.  Psych: Denies anxiety, depression, SI/HI.  No other specific complaints in a complete review of systems (except as listed in HPI above).  Objective:   Physical Exam BP 132/80 (BP Location: Right Arm, Patient Position: Sitting, Cuff Size: Normal)   Pulse 68   Temp (!) 96.6 F (35.9 C) (Temporal)   Wt 156 lb (70.8 kg)   SpO2 99%   BMI 24.43 kg/m   Wt  Readings from Last 3 Encounters:  09/09/22 152 lb (68.9 kg)  09/04/22 140 lb (63.5 kg)  04/04/22 154 lb (69.9 kg)    General: Appears her stated age, well developed, well nourished in NAD. Skin: Warm, dry and intact.  HEENT: Head: normal shape and size; Eyes: sclera white, no icterus, conjunctiva pink, PERRLA and EOMs intact;  Cardiovascular: Normal rate and rhythm. S1,S2 noted.  No murmur, rubs or gallops noted. No JVD or BLE edema. No carotid bruits noted. Pulmonary/Chest: Normal effort and coarse breath sounds. No  respiratory distress. No wheezes, rales or ronchi noted.  Musculoskeletal:  No difficulty with gait.  Neurological: Alert and oriented.  Coordination normal.    BMET    Component Value Date/Time   NA 143 09/09/2022 0929   NA 140 01/09/2015 1123   K 4.0 09/09/2022 0929   K 3.9 01/09/2015 1123   CL 110 09/09/2022 0929   CL 105 01/09/2015 1123   CO2 23 09/09/2022 0929   CO2 28 01/09/2015 1123   GLUCOSE 100 (H) 09/09/2022 0929   GLUCOSE 100 (H) 01/09/2015 1123   BUN 23 09/09/2022 0929   BUN 12 01/09/2015 1123   CREATININE 1.55 (H) 09/09/2022 0929   CALCIUM 9.3 09/09/2022 0929   CALCIUM 9.4 01/09/2015 1123   GFRNONAA 30 (L) 09/04/2022 2017   GFRNONAA 70 03/30/2020 0850   GFRAA 81 03/30/2020 0850    Lipid Panel     Component Value Date/Time   CHOL 143 04/04/2022 0833   TRIG 358 (H) 04/04/2022 0833   HDL 38 (L) 04/04/2022 0833   CHOLHDL 3.8 04/04/2022 0833   LDLCALC 62 04/04/2022 0833    CBC    Component Value Date/Time   WBC 7.6 09/04/2022 2017   RBC 4.62 09/04/2022 2017   HGB 15.0 09/04/2022 2017   HGB 14.7 01/09/2015 1123   HCT 44.3 09/04/2022 2017   HCT 43.0 01/09/2015 1123   PLT 171 09/04/2022 2017   PLT 128 (L) 01/09/2015 1123   MCV 95.9 09/04/2022 2017   MCV 97 01/09/2015 1123   MCH 32.5 09/04/2022 2017   MCHC 33.9 09/04/2022 2017   RDW 11.9 09/04/2022 2017   RDW 12.4 01/09/2015 1123   LYMPHSABS 2,153 03/30/2020 0850   LYMPHSABS 2.2  01/09/2015 1123   MONOABS 0.5 01/09/2015 1123   EOSABS 262 03/30/2020 0850   EOSABS 0.3 01/09/2015 1123   BASOSABS 28 03/30/2020 0850   BASOSABS 0.0 01/09/2015 1123   BASOSABS 1 01/09/2015 1123    Hgb A1C Lab Results  Component Value Date   HGBA1C 5.1 03/30/2020            Assessment & Plan:     RTC in 6 months for your annual exam Webb Silversmith, NP

## 2022-10-10 NOTE — Assessment & Plan Note (Signed)
C-Met today 

## 2022-10-10 NOTE — Assessment & Plan Note (Signed)
C-Met and lipid profile today Encouraged her to consume low-fat diet Encouraged her to start taking her aspirin again

## 2022-10-10 NOTE — Assessment & Plan Note (Signed)
C-Met and lipid profile today Encouraged her to consume low-fat diet 

## 2022-10-10 NOTE — Assessment & Plan Note (Signed)
Encourage smoking cessation She refuses lung cancer screening Not currently using any inhalers

## 2022-10-11 LAB — COMPLETE METABOLIC PANEL WITH GFR
AG Ratio: 1.8 (calc) (ref 1.0–2.5)
ALT: 22 U/L (ref 6–29)
AST: 19 U/L (ref 10–35)
Albumin: 4.2 g/dL (ref 3.6–5.1)
Alkaline phosphatase (APISO): 48 U/L (ref 37–153)
BUN: 15 mg/dL (ref 7–25)
CO2: 27 mmol/L (ref 20–32)
Calcium: 8.9 mg/dL (ref 8.6–10.4)
Chloride: 107 mmol/L (ref 98–110)
Creat: 0.73 mg/dL (ref 0.60–0.95)
Globulin: 2.3 g/dL (calc) (ref 1.9–3.7)
Glucose, Bld: 96 mg/dL (ref 65–99)
Potassium: 3.6 mmol/L (ref 3.5–5.3)
Sodium: 142 mmol/L (ref 135–146)
Total Bilirubin: 0.8 mg/dL (ref 0.2–1.2)
Total Protein: 6.5 g/dL (ref 6.1–8.1)
eGFR: 83 mL/min/{1.73_m2} (ref >=60)

## 2022-10-11 LAB — LIPID PANEL
Cholesterol: 133 mg/dL (ref <200)
HDL: 37 mg/dL — ABNORMAL LOW (ref >=50)
LDL Cholesterol (Calc): 65 mg/dL (calc)
Non-HDL Cholesterol (Calc): 96 mg/dL (calc) (ref <130)
Total CHOL/HDL Ratio: 3.6 (calc) (ref <5.0)
Triglycerides: 246 mg/dL — ABNORMAL HIGH (ref <150)

## 2022-10-11 LAB — CBC
HCT: 36.8 % (ref 35.0–45.0)
Hemoglobin: 12.5 g/dL (ref 11.7–15.5)
MCH: 33.3 pg — ABNORMAL HIGH (ref 27.0–33.0)
MCHC: 34 g/dL (ref 32.0–36.0)
MCV: 98.1 fL (ref 80.0–100.0)
MPV: 12.2 fL (ref 7.5–12.5)
Platelets: 136 10*3/uL — ABNORMAL LOW (ref 140–400)
RBC: 3.75 10*6/uL — ABNORMAL LOW (ref 3.80–5.10)
RDW: 12.3 % (ref 11.0–15.0)
WBC: 6 10*3/uL (ref 3.8–10.8)

## 2022-10-20 ENCOUNTER — Ambulatory Visit: Payer: Self-pay | Admitting: *Deleted

## 2022-10-20 NOTE — Telephone Encounter (Signed)
Summary: sinus infection? headache   Patient is wanting to know what to take for her sinus infection, pt has been taking Mucinex since last Friday and her head has been hurting since last Thursday. Please advise.           Chief Complaint: sinus pain, requesting medication or advise what to take  Symptoms: sinus pain and pressure forehead, nose and back of head blowing nose for green mucus . Using multiple OTC medications. Mucinex , Nyquil, nasal court. Has not tested for covid  Frequency: last Thursday  Pertinent Negatives: Patient denies chest pain no difficulty breathing no fever.  Disposition: [] ED /[] Urgent Care (no appt availability in office) / [x] Appointment(In office/virtual)/ []  [facility]/ [] Home Care/ [] Refused Recommended Disposition /[] [facility]/ []  Follow-up with PCP Additional Notes:   Appt scheduled for tomorrow. Patient requesting advise for medications instead of coming in for OV.      Reason for Disposition  [1] Sinus congestion (pressure, fullness) AND [2] present > 10 days  Answer Assessment - Initial Assessment Questions 1. LOCATION: "Where does it hurt?"      Forehead, sinus pressure  back of head 2. ONSET: "When did the sinus pain start?"  (e.g., hours, days)      Last Thursday  3. SEVERITY: "How bad is the pain?"   (Scale 1-10; mild, moderate or severe)   - MILD (1-3): doesn't interfere with normal activities    - MODERATE (4-7): interferes with normal activities (e.g., work or school) or awakens from sleep   - SEVERE (8-10): excruciating pain and patient unable to do any normal activities        Unable to sleep unless takes nyquil pain worse  4. RECURRENT SYMPTOM: "Have you ever had sinus problems before?" If Yes, ask: "When was the last time?" and "What happened that time?"      Not this bad 5. NASAL CONGESTION: "Is the nose blocked?" If Yes, ask: "Can you open it or must you breathe through your mouth?"     Better now  6.  NASAL DISCHARGE: "Do you have discharge from your nose?" If so ask, "What color?"     Green  7. FEVER: "Do you have a fever?" If Yes, ask: "What is it, how was it measured, and when did it start?"      Last Monday possible fever and sweated woke up "wet" and changed clothes and no other episodes noted  8. OTHER SYMPTOMS: "Do you have any other symptoms?" (e.g., sore throat, cough, earache, difficulty breathing)     Sinus pressure in forehead and nose ,back of head 9. PREGNANCY: "Is there any chance you are pregnant?" "When was your last menstrual period?"     na  Protocols used: Sinus Pain or Congestion-A-AH

## 2022-10-21 ENCOUNTER — Ambulatory Visit: Payer: Medicare PPO | Admitting: Internal Medicine

## 2022-10-21 ENCOUNTER — Encounter: Payer: Self-pay | Admitting: Internal Medicine

## 2022-10-21 VITALS — BP 144/60 | HR 84 | Ht 67.0 in | Wt 156.0 lb

## 2022-10-21 DIAGNOSIS — J0140 Acute pansinusitis, unspecified: Secondary | ICD-10-CM

## 2022-10-21 MED ORDER — AMOXICILLIN-POT CLAVULANATE 875-125 MG PO TABS: 1.0000 | ORAL_TABLET | Freq: Two times a day (BID) | ORAL | 0 refills | Status: DC

## 2022-10-21 NOTE — Patient Instructions (Signed)

## 2022-10-21 NOTE — Progress Notes (Addendum)
HPI  Patient presents to clinic today with complaint of headache, sinus pressure, nasal congestion and cough.  This started 1 week ago.  She is blowing clear mucous out of her nose. The cough is productive of clear mucous. She denies runny nose, ear pain, sore throat, shortness of breath, nausea, vomiting or diarrhea. She denies fever, chills or body aches. She is taking Mucinex OTC with minimal relief of symptoms. She has a history of COPD. She does smoke.  She has not had sick contacts that she is aware of.  Review of Systems     Past Medical History:  Diagnosis Date   Allergy    CHF (congestive heart failure) (HCC)    Colon polyps    COPD (chronic obstructive pulmonary disease) (HCC)    PONV (postoperative nausea and vomiting)     Family History  Problem Relation Age of Onset   Asthma Mother    Kidney cancer Father    Arthritis Sister    GER disease Sister    Diabetes Sister    Bone cancer Son    Kidney cancer Son     Social History   Socioeconomic History   Marital status: Widowed    Spouse name: Not on file   Number of children: Not on file   Years of education: Not on file   Highest education level: High school graduate  Occupational History   Occupation: retired  Tobacco Use   Smoking status: Every Day    Packs/day: 1.00    Years: 53.00    Total pack years: 53.00    Types: Cigarettes   Smokeless tobacco: Never   Tobacco comments:    she quit for 8 years in 1980s and would like to quit.  Meds not effective in past.  Vaping Use   Vaping Use: Never used  Substance and Sexual Activity   Alcohol use: Yes    Alcohol/week: 2.0 standard drinks of alcohol    Types: 2 Cans of beer per week    Comment: occasionally   Drug use: No   Sexual activity: Not on file  Other Topics Concern   Not on file  Social History Narrative   Pt lives alone   Social Determinants of Health   Financial Resource Strain: Low Risk  (02/04/2022)   Overall Financial Resource Strain  (CARDIA)    Difficulty of Paying Living Expenses: Not hard at all  Food Insecurity: No Food Insecurity (02/04/2022)   Hunger Vital Sign    Worried About Running Out of Food in the Last Year: Never true    Ran Out of Food in the Last Year: Never true  Transportation Needs: No Transportation Needs (02/04/2022)   PRAPARE - Hydrologist (Medical): No    Lack of Transportation (Non-Medical): No  Physical Activity: Inactive (02/04/2022)   Exercise Vital Sign    Days of Exercise per Week: 0 days    Minutes of Exercise per Session: 0 min  Stress: No Stress Concern Present (02/04/2022)   [place]    Feeling of Stress : Not at all  Social Connections: Socially Isolated (02/04/2022)   Social Connection and Isolation Panel [NHANES]    Frequency of Communication with Friends and Family: More than three times a week    Frequency of Social Gatherings with Friends and Family: More than three times a week    Attends Religious Services: Never    Retail buyer of [employer]  or Organizations: No    Attends Archivist Meetings: Never    Marital Status: Widowed  Intimate Partner Violence: Not At Risk (02/04/2022)   Humiliation, Afraid, Rape, and Kick questionnaire    Fear of Current or Ex-Partner: No    Emotionally Abused: No    Physically Abused: No    Sexually Abused: No    Allergies  Allergen Reactions   Codeine Nausea And Vomiting    Pt does not want to take any additional codeine products.     Constitutional: Positive headache. Denies fatigue, fever or abrupt weight changes.  HEENT:  Positive nasal congestion. Denies eye redness, ear pain, ringing in the ears, wax buildup, runny nose or sore throat. Respiratory: Positive cough. Denies difficulty breathing or shortness of breath.  Cardiovascular: Denies chest pain, chest tightness, palpitations or swelling in the hands or feet.   No other  specific complaints in a complete review of systems (except as listed in HPI above).  Objective:   BP (!) 144/60   Pulse 84   Ht 5\' 7"  (1.702 m)   Wt 156 lb (70.8 kg)   SpO2 97%   BMI 24.43 kg/m    General: Appears her stated age, appears unwell but in NAD. HEENT: Head: normal shape and size, frontal and maxillary sinus tenderness noted; Eyes: sclera white, no icterus, conjunctiva pink; Ears: Tm's gray and intact, normal light reflex; Nose: mucosa boggy and moist, septum midline; Throat/Mouth: + PND. Teeth present, mucosa erythematous and moist, no exudate noted, no lesions or ulcerations noted.  Neck:  No adenopathy noted.  Cardiovascular: Normal rate and rhythm. S1,S2 noted.  Murmur noted.  Pulmonary/Chest: Normal effort and coarse breath sounds. No respiratory distress. No wheezes, rales or ronchi noted.       Assessment & Plan:   Acute Bacterial Sinusitis  Can use a Neti Pot which can be purchased from your local drug store. Nasocort 2 sprays each nostril for 3 days and then as needed. Rx for Augmentin BID for 10 days  RTC in 5 months for annual exam Webb Silversmith, NP

## 2022-12-14 ENCOUNTER — Ambulatory Visit: Payer: Medicare PPO | Admitting: Family Medicine

## 2022-12-14 ENCOUNTER — Encounter: Payer: Self-pay | Admitting: Family Medicine

## 2022-12-14 VITALS — BP 122/68 | HR 72 | Ht 67.0 in | Wt 152.6 lb

## 2022-12-14 DIAGNOSIS — N3001 Acute cystitis with hematuria: Secondary | ICD-10-CM

## 2022-12-14 DIAGNOSIS — R310 Gross hematuria: Secondary | ICD-10-CM

## 2022-12-14 DIAGNOSIS — B379 Candidiasis, unspecified: Secondary | ICD-10-CM

## 2022-12-14 DIAGNOSIS — R319 Hematuria, unspecified: Secondary | ICD-10-CM | POA: Diagnosis not present

## 2022-12-14 LAB — POCT URINALYSIS DIPSTICK
Bilirubin, UA: NEGATIVE
Glucose, UA: NEGATIVE
Ketones, UA: NEGATIVE
Nitrite, UA: POSITIVE — AB
Protein, UA: POSITIVE — AB
Spec Grav, UA: >=1.03 — AB (ref 1.010–1.025)
Urobilinogen, UA: 0.2 E.U./dL
pH, UA: 6 (ref 5.0–8.0)

## 2022-12-14 MED ORDER — SULFAMETHOXAZOLE-TRIMETHOPRIM 800-160 MG PO TABS: 1.0000 | ORAL_TABLET | Freq: Two times a day (BID) | ORAL | 0 refills | Status: DC

## 2022-12-14 MED ORDER — FLUCONAZOLE 150 MG PO TABS: ORAL_TABLET | ORAL | 0 refills | Status: DC

## 2022-12-14 NOTE — Patient Instructions (Addendum)
Thank you for coming to the office today.  1. You have a Urinary Tract Infection - this is very common, your symptoms are reassuring and you should get better within 1 week on the antibiotics - Start Bactrim Sulfa antibiotic 2 times daily for next 7 days, complete entire course, even if feeling better - We sent urine for a culture, we will call you within next few days if we need to change antibiotics - Please drink plenty of fluids, improve hydration over next 1 week  If symptoms worsening, developing nausea / vomiting, worsening back pain, fevers / chills / sweats, then please return for re-evaluation sooner.  If you take AZO OTC - limit this to 2-3 days MAX to avoid affecting kidneys  PREVENTION D-Mannose is a natural supplement that can actually help bind to urinary bacteria and reduce their effectiveness it can help prevent UTI from forming, and may reduce some symptoms. It likely cannot cure an active UTI but it is worth a try and good to prevent them with. Try 500mg  twice a day at a full dose if you want, or check package instructions for more info  --------------------------  Referral sent to Urology due to the blood seen in the urine. They may take a CT scan or other testing.  [name] -[address],  [place]  [address] Phone: [phone]  Please schedule a Follow-up Appointment to: Return if symptoms worsen or fail to improve.  If you have any other questions or concerns, please feel free to call the office or send a message through [facility]. You may also schedule an earlier appointment if necessary.  Additionally, you may be receiving a survey about your experience at our office within a few days to 1 week by e-mail or mail. We value your feedback.  Kristine Putnam, DO [place]

## 2022-12-14 NOTE — Progress Notes (Signed)
Subjective:    Patient ID: Kristine Le, female    DOB: 03/09/1941, 82 y.o.   MRN: [id number]  Kristine Le is a 82 y.o. female presenting on 12/14/2022 for Urinary Tract Infection  Patient presents for a same day appointment.  PCP Webb Silversmith, FNP   HPI  UTI / Hematuria  Last Visit here 2 months ago with PCP treated for Sinusitis, given course of Augmentin. Has cleared up Prior to that she was seen in ED 08/2022 for AKI and UTI, had E Coli 100CFU on culture, treated with CTX and Keflex and then based on culture had resistance to Keflex, was switch to Bactrim course. This resolved.  Now recurrent problem. Past few days to week with return of symptoms. She has identified some blood in urine and even a small clot within urine with gross hematuria. She describes post void pain, not so much dysuria.  She has bladder dysfunction with some weak urinary stream and overactive bladder and incontinence at times  She drinks plenty of water regularly  Denies any fever chills nausea vomiting abdominal pain, flank pain      10/10/2022   10:12 AM 09/09/2022    9:23 AM 04/04/2022    9:37 AM  Depression screen PHQ 2/9  Decreased Interest 0 0 0  Down, Depressed, Hopeless 0 0 0  PHQ - 2 Score 0 0 0  Altered sleeping   0  Tired, decreased energy   0  Change in appetite   0  Feeling bad or failure about yourself    0  Trouble concentrating   0  Moving slowly or fidgety/restless   0  Suicidal thoughts   0  PHQ-9 Score   0  Difficult doing work/chores   Not difficult at all    Social History   Tobacco Use   Smoking status: Every Day    Packs/day: 1.00    Years: 53.00    Additional pack years: 0.00    Total pack years: 53.00    Types: Cigarettes   Smokeless tobacco: Never   Tobacco comments:    she quit for 8 years in 1980s and would like to quit.  Meds not effective in past.  Vaping Use   Vaping Use: Never used  Substance Use Topics   Alcohol use: Yes    Alcohol/week: 2.0  standard drinks of alcohol    Types: 2 Cans of beer per week    Comment: occasionally   Drug use: No    Review of Systems Per HPI unless specifically indicated above     Objective:    BP 122/68   Pulse 72   Ht 5\' 7"  (1.702 m)   Wt 152 lb 9.6 oz (69.2 kg)   SpO2 96%   BMI 23.90 kg/m   Wt Readings from Last 3 Encounters:  12/14/22 152 lb 9.6 oz (69.2 kg)  10/21/22 156 lb (70.8 kg)  10/10/22 156 lb (70.8 kg)    Physical Exam Vitals and nursing note reviewed.  Constitutional:      General: She is not in acute distress.    Appearance: Normal appearance. She is well-developed. She is not diaphoretic.     Comments: Well-appearing, comfortable, cooperative  HENT:     Head: Normocephalic and atraumatic.  Eyes:     General:        Right eye: No discharge.        Left eye: No discharge.     Conjunctiva/sclera: Conjunctivae normal.  Cardiovascular:  Rate and Rhythm: Normal rate.  Pulmonary:     Effort: Pulmonary effort is normal.  Skin:    General: Skin is warm and dry.     Findings: No erythema or rash.  Neurological:     Mental Status: She is alert and oriented to person, place, and time.  Psychiatric:        Mood and Affect: Mood normal.        Behavior: Behavior normal.        Thought Content: Thought content normal.     Comments: Well groomed, good eye contact, normal speech and thoughts    Results for orders placed or performed in visit on 12/14/22  POCT Urinalysis Dipstick  Result Value Ref Range   Color, UA Amber (A)    Clarity, UA Cloudy    Glucose, UA Negative Negative   Bilirubin, UA Negative    Ketones, UA Negative    Spec Grav, UA >=1.030 (A) 1.010 - 1.025   Blood, UA Large +++ (A)    pH, UA 6.0 5.0 - 8.0   Protein, UA Positive (A) Negative   Urobilinogen, UA 0.2 0.2 or 1.0 E.U./dL   Nitrite, UA Positive (A)    Leukocytes, UA Large (3+) (A) Negative   Appearance     Odor        Assessment & Plan:   Problem List Items Addressed This Visit    None Visit Diagnoses     Acute cystitis with hematuria    -  Primary   Relevant Medications   sulfamethoxazole-trimethoprim (BACTRIM DS) 800-160 MG tablet   Other Relevant Orders   Ambulatory referral to Urology   Gross hematuria       Relevant Orders   POCT Urinalysis Dipstick (Completed)   Urine Culture   Ambulatory referral to Urology   Antibiotic-induced yeast infection       Relevant Medications   sulfamethoxazole-trimethoprim (BACTRIM DS) 800-160 MG tablet   fluconazole (DIFLUCAN) 150 MG tablet       Clinically consistent with UTI and confirmed on UA Gross hematuria concerning. Prior UTI resistant E Coli 08/2022, had been resolved No concern for pyelo today (no systemic symptoms, neg fever, back pain, n/v).  Plan: 1. UA - large blood, leuks, nitrite positive 2. Ordered Urine culture 3. Rx Bactrim TWICE A DAY x 7 days (selected this based on last E Coli resistance pattern) - Add diflucan if need for yeast 4. Improve PO hydration  Referral to Urologist due to Gross Hematuria concern. Age 45 and smoker active. History not suggestive of nephrolithiasis. May warrant further work up if does not resolve w/ treating UTI. Now 2nd significant UTI in past 3 months.   Orders Placed This Encounter  Procedures   Urine Culture   Ambulatory referral to Urology    Referral Priority:   Routine    Referral Type:   Consultation    Referral Reason:   Specialty Services Required    Requested Specialty:   Urology    Number of Visits Requested:   1   POCT Urinalysis Dipstick     Meds ordered this encounter  Medications   sulfamethoxazole-trimethoprim (BACTRIM DS) 800-160 MG tablet    Sig: Take 1 tablet by mouth 2 (two) times daily.    Dispense:  14 tablet    Refill:  0   fluconazole (DIFLUCAN) 150 MG tablet    Sig: For yeast infection. Take one tablet by mouth on Day 1. Repeat dose 2nd tablet on Day 3.  Dispense:  2 tablet    Refill:  0     Follow up plan: Return if  symptoms worsen or fail to improve.   Nobie Putnam, [facility] 12/14/2022, 2:03 PM

## 2022-12-16 LAB — URINE CULTURE
MICRO NUMBER:: 14750441
SPECIMEN QUALITY:: ADEQUATE

## 2023-01-12 ENCOUNTER — Ambulatory Visit: Payer: Medicare PPO | Admitting: Urology

## 2023-01-13 ENCOUNTER — Telehealth: Payer: Self-pay | Admitting: Internal Medicine

## 2023-01-13 NOTE — Telephone Encounter (Signed)
Contacted Kristine Le to schedule their annual wellness visit. Appointment made for 02/10/2023.  Verlee Rossetti; Care Guide Ambulatory Clinical Support [place] [facility] Direct Dial: [phone]

## 2023-02-10 ENCOUNTER — Ambulatory Visit (INDEPENDENT_AMBULATORY_CARE_PROVIDER_SITE_OTHER): Payer: Medicare PPO

## 2023-02-10 VITALS — Ht 67.0 in | Wt 152.0 lb

## 2023-02-10 DIAGNOSIS — Z0000 Encounter for general adult medical examination without abnormal findings: Secondary | ICD-10-CM | POA: Diagnosis not present

## 2023-02-10 NOTE — Patient Instructions (Signed)
Kristine Le , Thank you for taking time to come for your Medicare Wellness Visit. I appreciate your ongoing commitment to your health goals. Please review the following plan we discussed and let me know if I can assist you in the future.   These are the goals we discussed:  Goals      DIET - EAT MORE FRUITS AND VEGETABLES     Exercise 3x per week (30 min per time)     Recommend walking 10-15 min 3x a week      Patient Stated     02/02/2021, no goals     Quit Smoking     Smoking cessation discussed        This is a list of the screening recommended for you and due dates:  Health Maintenance  Topic Date Due   COVID-19 Vaccine (1) Never done   DTaP/Tdap/Td vaccine (1 - Tdap) Never done   Screening for Lung Cancer  10/11/2023*   Flu Shot  04/20/2023   Medicare Annual Wellness Visit  02/10/2024   Pneumonia Vaccine  Completed   Zoster (Shingles) Vaccine  Completed   HPV Vaccine  Aged Out   DEXA scan (bone density measurement)  Discontinued   Hepatitis C Screening  Discontinued  *Topic was postponed. The date shown is not the original due date.    Advanced directives: no  Conditions/risks identified: none  Next appointment: Follow up in one year for your annual wellness visit 02/16/24 @ 8:15 am by phone   Preventive Care 65 Years and Older, Female Preventive care refers to lifestyle choices and visits with your health care provider that can promote health and wellness. What does preventive care include? A yearly physical exam. This is also called an annual well check. Dental exams once or twice a year. Routine eye exams. Ask your health care provider how often you should have your eyes checked. Personal lifestyle choices, including: Daily care of your teeth and gums. Regular physical activity. Eating a healthy diet. Avoiding tobacco and drug use. Limiting alcohol use. Practicing safe sex. Taking low-dose aspirin every day. Taking vitamin and mineral supplements as  recommended by your health care provider. What happens during an annual well check? The services and screenings done by your health care provider during your annual well check will depend on your age, overall health, lifestyle risk factors, and family history of disease. Counseling  Your health care provider may ask you questions about your: Alcohol use. Tobacco use. Drug use. Emotional well-being. Home and relationship well-being. Sexual activity. Eating habits. History of falls. Memory and ability to understand (cognition). Work and work Astronomer. Reproductive health. Screening  You may have the following tests or measurements: Height, weight, and BMI. Blood pressure. Lipid and cholesterol levels. These may be checked every 5 years, or more frequently if you are over 62 years old. Skin check. Lung cancer screening. You may have this screening every year starting at age 103 if you have a 30-pack-year history of smoking and currently smoke or have quit within the past 15 years. Fecal occult blood test (FOBT) of the stool. You may have this test every year starting at age 55. Flexible sigmoidoscopy or colonoscopy. You may have a sigmoidoscopy every 5 years or a colonoscopy every 10 years starting at age 74. Hepatitis C blood test. Hepatitis B blood test. Sexually transmitted disease (STD) testing. Diabetes screening. This is done by checking your blood sugar (glucose) after you have not eaten for a while (fasting). You  may have this done every 1-3 years. Bone density scan. This is done to screen for osteoporosis. You may have this done starting at age 40. Mammogram. This may be done every 1-2 years. Talk to your health care provider about how often you should have regular mammograms. Talk with your health care provider about your test results, treatment options, and if necessary, the need for more tests. Vaccines  Your health care provider may recommend certain vaccines, such  as: Influenza vaccine. This is recommended every year. Tetanus, diphtheria, and acellular pertussis (Tdap, Td) vaccine. You may need a Td booster every 10 years. Zoster vaccine. You may need this after age 76. Pneumococcal 13-valent conjugate (PCV13) vaccine. One dose is recommended after age 89. Pneumococcal polysaccharide (PPSV23) vaccine. One dose is recommended after age 28. Talk to your health care provider about which screenings and vaccines you need and how often you need them. This information is not intended to replace advice given to you by your health care provider. Make sure you discuss any questions you have with your health care provider. Document Released: 10/02/2015 Document Revised: 05/25/2016 Document Reviewed: 07/07/2015 Elsevier Interactive Patient Education  2017 [employer].  Fall Prevention in the Home Falls can cause injuries. They can happen to people of all ages. There are many things you can do to make your home safe and to help prevent falls. What can I do on the outside of my home? Regularly fix the edges of walkways and driveways and fix any cracks. Remove anything that might make you trip as you walk through a door, such as a raised step or threshold. Trim any bushes or trees on the path to your home. Use bright outdoor lighting. Clear any walking paths of anything that might make someone trip, such as rocks or tools. Regularly check to see if handrails are loose or broken. Make sure that both sides of any steps have handrails. Any raised decks and porches should have guardrails on the edges. Have any leaves, snow, or ice cleared regularly. Use sand or salt on walking paths during winter. Clean up any spills in your garage right away. This includes oil or grease spills. What can I do in the bathroom? Use night lights. Install grab bars by the toilet and in the tub and shower. Do not use towel bars as grab bars. Use non-skid mats or decals in the tub or  shower. If you need to sit down in the shower, use a plastic, non-slip stool. Keep the floor dry. Clean up any water that spills on the floor as soon as it happens. Remove soap buildup in the tub or shower regularly. Attach bath mats securely with double-sided non-slip rug tape. Do not have throw rugs and other things on the floor that can make you trip. What can I do in the bedroom? Use night lights. Make sure that you have a light by your bed that is easy to reach. Do not use any sheets or blankets that are too big for your bed. They should not hang down onto the floor. Have a firm chair that has side arms. You can use this for support while you get dressed. Do not have throw rugs and other things on the floor that can make you trip. What can I do in the kitchen? Clean up any spills right away. Avoid walking on wet floors. Keep items that you use a lot in easy-to-reach places. If you need to reach something above you, use a strong  step stool that has a grab bar. Keep electrical cords out of the way. Do not use floor polish or wax that makes floors slippery. If you must use wax, use non-skid floor wax. Do not have throw rugs and other things on the floor that can make you trip. What can I do with my stairs? Do not leave any items on the stairs. Make sure that there are handrails on both sides of the stairs and use them. Fix handrails that are broken or loose. Make sure that handrails are as long as the stairways. Check any carpeting to make sure that it is firmly attached to the stairs. Fix any carpet that is loose or worn. Avoid having throw rugs at the top or bottom of the stairs. If you do have throw rugs, attach them to the floor with carpet tape. Make sure that you have a light switch at the top of the stairs and the bottom of the stairs. If you do not have them, ask someone to add them for you. What else can I do to help prevent falls? Wear shoes that: Do not have high heels. Have  rubber bottoms. Are comfortable and fit you well. Are closed at the toe. Do not wear sandals. If you use a stepladder: Make sure that it is fully opened. Do not climb a closed stepladder. Make sure that both sides of the stepladder are locked into place. Ask someone to hold it for you, if possible. Clearly mark and make sure that you can see: Any grab bars or handrails. First and last steps. Where the edge of each step is. Use tools that help you move around (mobility aids) if they are needed. These include: Canes. Walkers. Scooters. Crutches. Turn on the lights when you go into a dark area. Replace any light bulbs as soon as they burn out. Set up your furniture so you have a clear path. Avoid moving your furniture around. If any of your floors are uneven, fix them. If there are any pets around you, be aware of where they are. Review your medicines with your doctor. Some medicines can make you feel dizzy. This can increase your chance of falling. Ask your doctor what other things that you can do to help prevent falls. This information is not intended to replace advice given to you by your health care provider. Make sure you discuss any questions you have with your health care provider. Document Released: 07/02/2009 Document Revised: 02/11/2016 Document Reviewed: 10/10/2014 Elsevier Interactive Patient Education  2017 [employer].

## 2023-02-10 NOTE — Progress Notes (Signed)
I connected with  Kristine Le on 02/10/23 by a audio enabled telemedicine application and verified that I am speaking with the correct person using two identifiers.  Patient Location: Home  Provider Location: Office/Clinic  I discussed the limitations of evaluation and management by telemedicine. The patient expressed understanding and agreed to proceed.  Subjective:   Kristine Le is a 82 y.o. female who presents for Medicare Annual (Subsequent) preventive examination.  Review of Systems     Cardiac Risk Factors include: advanced age (>74men, >34 women);sedentary lifestyle;smoking/ tobacco exposure     Objective:    Today's Vitals   02/10/23 0901  PainSc: 0-No pain   There is no height or weight on file to calculate BMI.     02/10/2023    9:07 AM 09/04/2022    8:17 PM 02/04/2022    9:24 AM 02/02/2021    9:45 AM 09/09/2020    7:53 AM 01/22/2019   11:02 AM 10/10/2017   10:30 AM  Advanced Directives  Does Patient Have a Medical Advance Directive? No No No No No No No  Would patient like information on creating a medical advance directive? No - Patient declined  No - Patient declined  No - Patient declined Yes (MAU/Ambulatory/Procedural Areas - Information given) Yes (MAU/Ambulatory/Procedural Areas - Information given)    Current Medications (verified) Outpatient Encounter Medications as of 02/10/2023  Medication Sig   Ascorbic Acid (VITAMIN C) 1000 MG tablet Take 1,000 mg by mouth daily.   B Complex-C-E-Zn (B COMPLEX-C-E-ZINC) tablet Take 1 tablet by mouth daily.   cetirizine (ZYRTEC) 10 MG tablet Take 10 mg by mouth daily.   Cholecalciferol (VITAMIN D3) 1000 units CAPS Take by mouth.   Collagen 500 MG CAPS Take by mouth.   Zinc Sulfate (ZINC 15 PO) Take by mouth.   aspirin EC 81 MG tablet Take 1 tablet (81 mg total) by mouth daily. Swallow whole. (Patient not taking: Reported on 10/10/2022)   fluconazole (DIFLUCAN) 150 MG tablet For yeast infection. Take one tablet by mouth  on Day 1. Repeat dose 2nd tablet on Day 3. (Patient not taking: Reported on 02/10/2023)   ibuprofen (ADVIL,MOTRIN) 200 MG tablet Take by mouth. (Patient not taking: Reported on 02/10/2023)   sulfamethoxazole-trimethoprim (BACTRIM DS) 800-160 MG tablet Take 1 tablet by mouth 2 (two) times daily. (Patient not taking: Reported on 02/10/2023)   No facility-administered encounter medications on file as of 02/10/2023.    Allergies (verified) Codeine   History: Past Medical History:  Diagnosis Date   Allergy    CHF (congestive heart failure) (HCC)    Colon polyps    COPD (chronic obstructive pulmonary disease) (HCC)    PONV (postoperative nausea and vomiting)    Past Surgical History:  Procedure Laterality Date   CATARACT EXTRACTION W/PHACO Left 09/09/2020   Procedure: CATARACT EXTRACTION PHACO AND INTRAOCULAR LENS PLACEMENT (IOC) LEFT 8.52 01:05.1 13.1%;  Surgeon: Lockie Mola, MD;  Location: [facility];  Service: Ophthalmology;  Laterality: Left;   KNEE SURGERY     PARTIAL HYSTERECTOMY     THUMB ARTHROSCOPY     Family History  Problem Relation Age of Onset   Asthma Mother    Kidney cancer Father    Arthritis Sister    GER disease Sister    Diabetes Sister    Bone cancer Son    Kidney cancer Son    Social History   Socioeconomic History   Marital status: Widowed    Spouse name: Not on file  Number of children: Not on file   Years of education: Not on file   Highest education level: High school graduate  Occupational History   Occupation: retired  Tobacco Use   Smoking status: Every Day    Packs/day: 1.00    Years: 53.00    Additional pack years: 0.00    Total pack years: 53.00    Types: Cigarettes   Smokeless tobacco: Never   Tobacco comments:    she quit for 8 years in 1980s and would like to quit.  Meds not effective in past.  Vaping Use   Vaping Use: Never used  Substance and Sexual Activity   Alcohol use: Yes    Alcohol/week: 2.0 standard  drinks of alcohol    Types: 2 Cans of beer per week    Comment: occasionally   Drug use: No   Sexual activity: Not on file  Other Topics Concern   Not on file  Social History Narrative   Pt lives alone   Social Determinants of Health   Financial Resource Strain: Low Risk  (02/10/2023)   Overall Financial Resource Strain (CARDIA)    Difficulty of Paying Living Expenses: Not hard at all  Food Insecurity: No Food Insecurity (02/10/2023)   Hunger Vital Sign    Worried About Running Out of Food in the Last Year: Never true    Ran Out of Food in the Last Year: Never true  Transportation Needs: No Transportation Needs (02/10/2023)   PRAPARE - Administrator, Civil Service (Medical): No    Lack of Transportation (Non-Medical): No  Physical Activity: Insufficiently Active (02/10/2023)   Exercise Vital Sign    Days of Exercise per Week: 7 days    Minutes of Exercise per Session: 10 min  Stress: No Stress Concern Present (02/10/2023)   [employer] of Occupational Health - Occupational Stress Questionnaire    Feeling of Stress : Not at all  Social Connections: Socially Isolated (02/10/2023)   Social Connection and Isolation Panel [NHANES]    Frequency of Communication with Friends and Family: Once a week    Frequency of Social Gatherings with Friends and Family: Once a week    Attends Religious Services: More than 4 times per year    Active Member of [employer] or Organizations: No    Attends Banker Meetings: Never    Marital Status: Widowed    Tobacco Counseling Ready to quit: Not Answered Counseling given: Not Answered Tobacco comments: she quit for 8 years in 1980s and would like to quit.  Meds not effective in past.   Clinical Intake:  Pre-visit preparation completed: Yes  Pain : No/denies pain Pain Score: 0-No pain     Nutritional Risks: None Diabetes: No  How often do you need to have someone help you when you read instructions, pamphlets, or  other written materials from your doctor or pharmacy?: 1 - Never  Diabetic?no  Interpreter Needed?: No  Information entered by :: Kennedy Bucker, LPN   Activities of Daily Living    02/10/2023    9:08 AM 10/10/2022   10:12 AM  In your present state of health, do you have any difficulty performing the following activities:  Hearing? 0 0  Vision? 0 0  Difficulty concentrating or making decisions? 0 0  Walking or climbing stairs? 0 0  Dressing or bathing? 0 0  Doing errands, shopping? 0 0  Preparing Food and eating ? N   Using the Toilet? N  In the past six months, have you accidently leaked urine? N   Do you have problems with loss of bowel control? N   Managing your Medications? N   Managing your Finances? N   Housekeeping or managing your Housekeeping? N     Patient Care Team: Lorre Munroe, NP as PCP - General (Internal Medicine) Candelaria Stagers, DPM as Consulting Physician (Podiatry)  Indicate any recent Medical Services you may have received from other than [facility] providers in the past year (date may be approximate).     Assessment:   This is a routine wellness examination for [place].  Hearing/Vision screen Hearing Screening - Comments:: No aids Vision Screening - Comments:: Wears glasses- Dr.Shade  Dietary issues and exercise activities discussed: Current Exercise Habits: Home exercise routine, Type of exercise: walking, Time (Minutes): 10, Frequency (Times/Week): 7, Weekly Exercise (Minutes/Week): 70, Intensity: Mild   Goals Addressed             This Visit's Progress    DIET - EAT MORE FRUITS AND VEGETABLES         Depression Screen    02/10/2023    9:06 AM 10/10/2022   10:12 AM 09/09/2022    9:23 AM 04/04/2022    9:37 AM 02/04/2022    9:22 AM 04/01/2021    9:00 AM 02/02/2021    9:46 AM  PHQ 2/9 Scores  PHQ - 2 Score 0 0 0 0 0 0 0  PHQ- 9 Score 0   0  0     Fall Risk    02/10/2023    9:08 AM 10/10/2022   10:12 AM 09/09/2022    9:23 AM 04/04/2022     9:37 AM 02/04/2022    9:25 AM  Fall Risk   Falls in the past year? 0 0 0 0 0  Number falls in past yr: 0   0 0  Injury with Fall? 0 0 0 0 0  Risk for fall due to : No Fall Risks No Fall Risks  No Fall Risks No Fall Risks  Follow up Falls prevention discussed;Falls evaluation completed   Falls evaluation completed Falls prevention discussed    FALL RISK PREVENTION PERTAINING TO THE HOME:  Any stairs in or around the home? Yes  If so, are there any without handrails? No  Home free of loose throw rugs in walkways, pet beds, electrical cords, etc? Yes  Adequate lighting in your home to reduce risk of falls? Yes   ASSISTIVE DEVICES UTILIZED TO PREVENT FALLS:  Life alert? No  Use of a cane, walker or w/c? No  Grab bars in the bathroom? No  Shower chair or bench in shower? No  Elevated toilet seat or a handicapped toilet? No    Cognitive Function:        02/10/2023    9:12 AM 02/02/2021    9:50 AM 01/22/2019   11:02 AM  6CIT Screen  What Year? 0 points 0 points 0 points  What month? 0 points 0 points 0 points  What time? 0 points 0 points 0 points  Count back from 20 0 points 4 points 0 points  Months in reverse 0 points 0 points 0 points  Repeat phrase 0 points 2 points 0 points  Total Score 0 points 6 points 0 points    Immunizations Immunization History  Administered Date(s) Administered   Fluad Quad(high Dose 65+) 06/27/2022   Influenza Split 06/24/2014   Influenza,inj,Quad PF,6+ Mos 07/06/2018   Influenza-Unspecified 06/07/2012,  06/24/2015, 06/20/2016, 06/27/2017   Pneumococcal Conjugate-13 12/27/2017   Pneumococcal Polysaccharide-23 06/24/2014   Zoster Recombinat (Shingrix) 01/05/2018, 07/06/2018    TDAP status: Due, Education has been provided regarding the importance of this vaccine. Advised may receive this vaccine at local pharmacy or Health Dept. Aware to provide a copy of the vaccination record if obtained from local pharmacy or Health Dept. Verbalized  acceptance and understanding.  Flu Vaccine status: Up to date  Pneumococcal vaccine status: Up to date  Covid-19 vaccine status: Declined, Education has been provided regarding the importance of this vaccine but patient still declined. Advised may receive this vaccine at local pharmacy or Health Dept.or vaccine clinic. Aware to provide a copy of the vaccination record if obtained from local pharmacy or Health Dept. Verbalized acceptance and understanding.  Qualifies for Shingles Vaccine? Yes   Zostavax completed No   Shingrix Completed?: Yes  Screening Tests Health Maintenance  Topic Date Due   COVID-19 Vaccine (1) Never done   DTaP/Tdap/Td (1 - Tdap) Never done   Lung Cancer Screening  10/11/2023 (Originally 04/03/2021)   INFLUENZA VACCINE  04/20/2023   Medicare Annual Wellness (AWV)  02/10/2024   Pneumonia Vaccine 48+ Years old  Completed   Zoster Vaccines- Shingrix  Completed   HPV VACCINES  Aged Out   DEXA SCAN  Discontinued   Hepatitis C Screening  Discontinued    Health Maintenance  Health Maintenance Due  Topic Date Due   COVID-19 Vaccine (1) Never done   DTaP/Tdap/Td (1 - Tdap) Never done    Colorectal cancer screening: No longer required.   Mammogram status: No longer required due to age.  [facility] declined referral  Lung Cancer Screening: (Low Dose CT Chest recommended if Age 80-80 years, 30 pack-year currently smoking OR have quit w/in 15years.) does qualify.   Lung Cancer Screening Referral: postponed until 10/11/23  Additional Screening:  Hepatitis C Screening: does not qualify; Completed no  Vision Screening: Recommended annual ophthalmology exams for early detection of glaucoma and other disorders of the eye. Is the patient up to date with their annual eye exam?  Yes  Who is the provider or what is the name of the office in which the patient attends annual eye exams? Dr.Shade If pt is not established with a provider, would they like to be referred to a  provider to establish care? No .   Dental Screening: Recommended annual dental exams for proper oral hygiene  Community Resource Referral / Chronic Care Management: CRR required this visit?  No   CCM required this visit?  No      Plan:     I have personally reviewed and noted the following in the patient's chart:   Medical and social history Use of alcohol, tobacco or illicit drugs  Current medications and supplements including opioid prescriptions. Patient is not currently taking opioid prescriptions. Functional ability and status Nutritional status Physical activity Advanced directives List of other physicians Hospitalizations, surgeries, and ER visits in previous 12 months Vitals Screenings to include cognitive, depression, and falls Referrals and appointments  In addition, I have reviewed and discussed with patient certain preventive protocols, quality metrics, and best practice recommendations. A written personalized care plan for preventive services as well as general preventive health recommendations were provided to patient.     Hal Hope, LPN   11/02/863   Nurse Notes: none

## 2023-03-14 ENCOUNTER — Telehealth: Payer: Self-pay

## 2023-03-14 NOTE — Telephone Encounter (Signed)
She has no diagnosis of anxiety, depression or PTSD so I can not write a letter for her to have an emotional support animal.

## 2023-03-14 NOTE — Telephone Encounter (Signed)
Copied from CRM [id number]. Topic: General - Other >> Mar 14, 2023 10:40 AM Clide Dales wrote: Patient would like to know if Rene Kocher will write a letter stating that patients pet is an emotional support animal so that she can take her wherever she goes. Please advise.

## 2023-03-15 NOTE — Telephone Encounter (Signed)
Pt advised.   Thanks,   -Kristine Le  

## 2023-08-07 ENCOUNTER — Ambulatory Visit: Payer: Self-pay | Admitting: *Deleted

## 2023-08-07 NOTE — Telephone Encounter (Signed)
  Chief Complaint: Left ear pain Symptoms: Popping and pain in left ear and left side of her sinus area has a lot of pressure and pain. Frequency: Since a week ago Sat.  Pertinent Negatives: Patient denies Mucinix DM helping Disposition: [] ED /[] Urgent Care (no appt availability in office) / [x] Appointment(In office/virtual)/ []  [facility]/ [] Home Care/ [] Refused Recommended Disposition /[] [facility]/ []  Follow-up with PCP Additional Notes: Appt made with Nicki Reaper, NP or 08/09/2023 at 8:00.   I put her on the wait list in case of a cancellation and sending a message to see if she can be worked in.

## 2023-08-07 NOTE — Telephone Encounter (Signed)
Reason for Disposition  Earache  (Exceptions: brief ear pain of < 60 minutes duration, earache occurring during air travel  Answer Assessment - Initial Assessment Questions 1. LOCATION: "Which ear is involved?"     Left ear is hurting.  Since a week ago Sat.   2. ONSET: "When did the ear start hurting"      A week ago Sat. It popped when I blew my nose.   I hear air in my ear when I breath.   The whole left side of my head is stopped up.   I did have a sore throat but that's gone    3. SEVERITY: "How bad is the pain?"  (Scale 1-10; mild, moderate or severe)   - MILD (1-3): doesn't interfere with normal activities    - MODERATE (4-7): interferes with normal activities or awakens from sleep    - SEVERE (8-10): excruciating pain, unable to do any normal activities      Moderate  4. URI SYMPTOMS: "Do you have a runny nose or cough?"     Yes the left side of my head is stopped up.   5. FEVER: "Do you have a fever?" If Yes, ask: "What is your temperature, how was it measured, and when did it start?"     On and off.    6. CAUSE: "Have you been swimming recently?", "How often do you use Q-TIPS?", "Have you had any recent air travel or scuba diving?"     Not asked 7. OTHER SYMPTOMS: "Do you have any other symptoms?" (e.g., headache, stiff neck, dizziness, vomiting, runny nose, decreased hearing)     See above 8. PREGNANCY: "Is there any chance you are pregnant?" "When was your last menstrual period?"     N/A  Protocols used: Davina Poke

## 2023-08-07 NOTE — Telephone Encounter (Signed)
I can see her tomorrow at 10:20 or 4 pm

## 2023-08-07 NOTE — Telephone Encounter (Signed)
Patient scheduled for 08/08/2023 at 10:20. Voiced understanding.

## 2023-08-08 ENCOUNTER — Ambulatory Visit: Payer: Medicare PPO | Admitting: Internal Medicine

## 2023-08-08 ENCOUNTER — Encounter: Payer: Self-pay | Admitting: Internal Medicine

## 2023-08-08 VITALS — BP 108/70 | HR 77 | Ht 67.0 in | Wt 155.4 lb

## 2023-08-08 DIAGNOSIS — H6992 Unspecified Eustachian tube disorder, left ear: Secondary | ICD-10-CM | POA: Diagnosis not present

## 2023-08-08 NOTE — Progress Notes (Signed)
Subjective:    Patient ID: Kristine Le, female    DOB: 13-Jan-1941, 82 y.o.   MRN: [id number]  HPI  Discussed the use of AI scribe software for clinical note transcription with the patient, who gave verbal consent to proceed.  The patient presented with a chief complaint of left ear pain that began a week ago, following a resolved sore throat. They described the pain as severe, likening it to an explosion, and noted a sensation of air in the ear when breathing. The patient also reported muffled hearing in the affected ear, but denied any drainage or leakage. The pain has since resolved, but the muffled hearing persists.  The patient has been managing the symptoms with over-the-counter Mucinex, but reported minimal relief. They denied experiencing any dizziness or lightheadedness. The patient also mentioned a chronic cough, which she does not feel like is related to the current ear issue but rather her COPD.  In the past, the patient had an ear burst at the age of [age], which may have resulted in some scarring tissue.       Review of Systems   Past Medical History:  Diagnosis Date   Allergy    CHF (congestive heart failure) (HCC)    Colon polyps    COPD (chronic obstructive pulmonary disease) (HCC)    PONV (postoperative nausea and vomiting)     Current Outpatient Medications  Medication Sig Dispense Refill   Ascorbic Acid (VITAMIN C) 1000 MG tablet Take 1,000 mg by mouth daily.     aspirin EC 81 MG tablet Take 1 tablet (81 mg total) by mouth daily. Swallow whole. (Patient not taking: Reported on 10/10/2022) 30 tablet 12   B Complex-C-E-Zn (B COMPLEX-C-E-ZINC) tablet Take 1 tablet by mouth daily.     cetirizine (ZYRTEC) 10 MG tablet Take 10 mg by mouth daily.     Cholecalciferol (VITAMIN D3) 1000 units CAPS Take by mouth.     Collagen 500 MG CAPS Take by mouth.     fluconazole (DIFLUCAN) 150 MG tablet For yeast infection. Take one tablet by mouth on Day 1. Repeat dose 2nd tablet  on Day 3. (Patient not taking: Reported on 02/10/2023) 2 tablet 0   ibuprofen (ADVIL,MOTRIN) 200 MG tablet Take by mouth. (Patient not taking: Reported on 02/10/2023)     sulfamethoxazole-trimethoprim (BACTRIM DS) 800-160 MG tablet Take 1 tablet by mouth 2 (two) times daily. (Patient not taking: Reported on 02/10/2023) 14 tablet 0   Zinc Sulfate (ZINC 15 PO) Take by mouth.     No current facility-administered medications for this visit.    Allergies  Allergen Reactions   Codeine Nausea And Vomiting    Pt does not want to take any additional codeine products.    Family History  Problem Relation Age of Onset   Asthma Mother    Kidney cancer Father    Arthritis Sister    GER disease Sister    Diabetes Sister    Bone cancer Son    Kidney cancer Son     Social History   Socioeconomic History   Marital status: Widowed    Spouse name: Not on file   Number of children: Not on file   Years of education: Not on file   Highest education level: High school graduate  Occupational History   Occupation: retired  Tobacco Use   Smoking status: Every Day    Current packs/day: 1.00    Average packs/day: 1 pack/day for 53.0 years (53.0 ttl pk-yrs)  Types: Cigarettes   Smokeless tobacco: Never   Tobacco comments:    she quit for 8 years in 1980s and would like to quit.  Meds not effective in past.  Vaping Use   Vaping status: Never Used  Substance and Sexual Activity   Alcohol use: Yes    Alcohol/week: 2.0 standard drinks of alcohol    Types: 2 Cans of beer per week    Comment: occasionally   Drug use: No   Sexual activity: Not on file  Other Topics Concern   Not on file  Social History Narrative   Pt lives alone   Social Determinants of Health   Financial Resource Strain: Low Risk  (02/10/2023)   Overall Financial Resource Strain (CARDIA)    Difficulty of Paying Living Expenses: Not hard at all  Food Insecurity: No Food Insecurity (02/10/2023)   Hunger Vital Sign    Worried  About Running Out of Food in the Last Year: Never true    Ran Out of Food in the Last Year: Never true  Transportation Needs: No Transportation Needs (02/10/2023)   PRAPARE - Administrator, Civil Service (Medical): No    Lack of Transportation (Non-Medical): No  Physical Activity: Insufficiently Active (02/10/2023)   Exercise Vital Sign    Days of Exercise per Week: 7 days    Minutes of Exercise per Session: 10 min  Stress: No Stress Concern Present (02/10/2023)   [employer] of Occupational Health - Occupational Stress Questionnaire    Feeling of Stress : Not at all  Social Connections: Socially Isolated (02/10/2023)   Social Connection and Isolation Panel [NHANES]    Frequency of Communication with Friends and Family: Once a week    Frequency of Social Gatherings with Friends and Family: Once a week    Attends Religious Services: More than 4 times per year    Active Member of [employer] or Organizations: No    Attends Banker Meetings: Never    Marital Status: Widowed  Intimate Partner Violence: Not At Risk (02/10/2023)   Humiliation, Afraid, Rape, and Kick questionnaire    Fear of Current or Ex-Partner: No    Emotionally Abused: No    Physically Abused: No    Sexually Abused: No     Constitutional: Denies fever, malaise, fatigue, headache or abrupt weight changes.  HEENT: Pt reports left ear pain. Denies eye pain, eye redness, ear pain, ringing in the ears, wax buildup, runny nose, nasal congestion, bloody nose, or sore throat. Respiratory: Pt reports chronic cough. Denies difficulty breathing, shortness of breath, or sputum production.   Cardiovascular: Denies chest pain, chest tightness, palpitations or swelling in the hands or feet.  Gastrointestinal: Denies abdominal pain, bloating, constipation, diarrhea or blood in the stool.  Neurological: Denies dizziness, difficulty with memory, difficulty with speech or problems with balance and coordination.     No other specific complaints in a complete review of systems (except as listed in HPI above).      Objective:   Physical Exam  BP 108/70   Pulse 77   Ht 5\' 7"  (1.702 m)   Wt 155 lb 6.4 oz (70.5 kg)   SpO2 97%   BMI 24.34 kg/m   Wt Readings from Last 3 Encounters:  02/10/23 152 lb (68.9 kg)  12/14/22 152 lb 9.6 oz (69.2 kg)  10/21/22 156 lb (70.8 kg)    General: Appears her stated age, well developed, well nourished in NAD. Skin: Warm, dry and intact. HEENT:  Head: normal shape and size, no sinus tenderness noted; Eyes: sclera white, no icterus, conjunctiva pink, PERRLA and EOMs intact; Left Ear: Tm's gray and intact, normal light reflex, + serous effusion noted; Nose: mucosa pink and moist, septum midline; Throat/Mouth: Teeth present, mucosa pink and moist, no exudate, lesions or ulcerations noted.  Neck:  No adenopathy noted. Cardiovascular: Normal rate and rhythm. S1,S2 noted.  No murmur, rubs or gallops noted.  Pulmonary/Chest: Normal effort and coarse breath sounds. No respiratory distress. No wheezes, rales or ronchi noted.  Musculoskeletal:  No difficulty with gait.  Neurological: Alert and oriented. Coordination normal.    BMET    Component Value Date/Time   NA 142 10/10/2022 1012   NA 140 01/09/2015 1123   K 3.6 10/10/2022 1012   K 3.9 01/09/2015 1123   CL 107 10/10/2022 1012   CL 105 01/09/2015 1123   CO2 27 10/10/2022 1012   CO2 28 01/09/2015 1123   GLUCOSE 96 10/10/2022 1012   GLUCOSE 100 (H) 01/09/2015 1123   BUN 15 10/10/2022 1012   BUN 12 01/09/2015 1123   CREATININE 0.73 10/10/2022 1012   CALCIUM 8.9 10/10/2022 1012   CALCIUM 9.4 01/09/2015 1123   GFRNONAA 30 (L) 09/04/2022 2017   GFRNONAA 70 03/30/2020 0850   GFRAA 81 03/30/2020 0850    Lipid Panel     Component Value Date/Time   CHOL 133 10/10/2022 1012   TRIG 246 (H) 10/10/2022 1012   HDL 37 (L) 10/10/2022 1012   CHOLHDL 3.6 10/10/2022 1012   LDLCALC 65 10/10/2022 1012    CBC     Component Value Date/Time   WBC 6.0 10/10/2022 1012   RBC 3.75 (L) 10/10/2022 1012   HGB 12.5 10/10/2022 1012   HGB 14.7 01/09/2015 1123   HCT 36.8 10/10/2022 1012   HCT 43.0 01/09/2015 1123   PLT 136 (L) 10/10/2022 1012   PLT 128 (L) 01/09/2015 1123   MCV 98.1 10/10/2022 1012   MCV 97 01/09/2015 1123   MCH 33.3 (H) 10/10/2022 1012   MCHC 34.0 10/10/2022 1012   RDW 12.3 10/10/2022 1012   RDW 12.4 01/09/2015 1123   LYMPHSABS 2,153 03/30/2020 0850   LYMPHSABS 2.2 01/09/2015 1123   MONOABS 0.5 01/09/2015 1123   EOSABS 262 03/30/2020 0850   EOSABS 0.3 01/09/2015 1123   BASOSABS 28 03/30/2020 0850   BASOSABS 0.0 01/09/2015 1123   BASOSABS 1 01/09/2015 1123    Hgb A1C Lab Results  Component Value Date   HGBA1C 5.1 03/30/2020            Assessment & Plan:   Assessment and Plan    ETD, left Resolved symptoms of ear pain and muffled hearing. No signs of infection or eardrum rupture on examination. Some clear fluid noted behind the eardrum. -If symptoms recur, advised to use Nasacort as patient cannot tolerate Flonase. -No indication for antibiotics or steroids at this time  Schedule an appointment for your annual exam Nicki Reaper, NP

## 2023-08-08 NOTE — Patient Instructions (Signed)
Eustachian Tube Dysfunction  Eustachian tube dysfunction refers to a condition in which a blockage develops in the narrow passage that connects the middle ear to the back of the nose (eustachian tube). The eustachian tube regulates air pressure in the middle ear by letting air move between the ear and nose. It also helps to drain fluid from the middle ear space. Eustachian tube dysfunction can affect one or both ears. When the eustachian tube does not function properly, air pressure, fluid, or both can build up in the middle ear. What are the causes? This condition occurs when the eustachian tube becomes blocked or cannot open normally. Common causes of this condition include: Ear infections. Colds and other infections that affect the nose, mouth, and throat (upper respiratory tract). Allergies. Irritation from cigarette smoke. Irritation from stomach acid coming up into the esophagus (gastroesophageal reflux). The esophagus is the part of the body that moves food from the mouth to the stomach. Sudden changes in air pressure, such as from descending in an airplane or scuba diving. Abnormal growths in the nose or throat, such as: Growths that line the nose (nasal polyps). Abnormal growth of cells (tumors). Enlarged tissue at the back of the throat (adenoids). What increases the risk? You are more likely to develop this condition if: You smoke. You are overweight. You are a child who has: Certain birth defects of the mouth, such as cleft palate. Large tonsils or adenoids. What are the signs or symptoms? Common symptoms of this condition include: A feeling of fullness in the ear. Ear pain. Clicking or popping noises in the ear. Ringing in the ear (tinnitus). Hearing loss. Loss of balance. Dizziness. Symptoms may get worse when the air pressure around you changes, such as when you travel to an area of high elevation, fly on an airplane, or go scuba diving. How is this diagnosed? This  condition may be diagnosed based on: Your symptoms. A physical exam of your ears, nose, and throat. Tests, such as those that measure: The movement of your eardrum. Your hearing (audiometry). How is this treated? Treatment depends on the cause and severity of your condition. In mild cases, you may relieve your symptoms by moving air into your ears. This is called "popping the ears." In more severe cases, or if you have symptoms of fluid in your ears, treatment may include: Medicines to relieve congestion (decongestants). Medicines that treat allergies (antihistamines). Nasal sprays or ear drops that contain medicines that reduce swelling (steroids). A procedure to drain the fluid in your eardrum. In this procedure, a small tube may be placed in the eardrum to: Drain the fluid. Restore the air in the middle ear space. A procedure to insert a balloon device through the nose to inflate the opening of the eustachian tube (balloon dilation). Follow these instructions at home: Lifestyle Do not do any of the following until your health care provider approves: Travel to high altitudes. Fly in airplanes. Work in a pressurized cabin or room. Scuba dive. Do not use any products that contain nicotine or tobacco. These products include cigarettes, chewing tobacco, and vaping devices, such as e-cigarettes. If you need help quitting, ask your health care provider. Keep your ears dry. Wear fitted earplugs during showering and bathing. Dry your ears completely after. General instructions Take over-the-counter and prescription medicines only as told by your health care provider. Use techniques to help pop your ears as recommended by your health care provider. These may include: Chewing gum. Yawning. Frequent, forceful swallowing.   Closing your mouth, holding your nose closed, and gently blowing as if you are trying to blow air out of your nose. Keep all follow-up visits. This is important. Contact a  health care provider if: Your symptoms do not go away after treatment. Your symptoms come back after treatment. You are unable to pop your ears. You have: A fever. Pain in your ear. Pain in your head or neck. Fluid draining from your ear. Your hearing suddenly changes. You become very dizzy. You lose your balance. Get help right away if: You have a sudden, severe increase in any of your symptoms. Summary Eustachian tube dysfunction refers to a condition in which a blockage develops in the eustachian tube. It can be caused by ear infections, allergies, inhaled irritants, or abnormal growths in the nose or throat. Symptoms may include ear pain or fullness, hearing loss, or ringing in the ears. Mild cases are treated with techniques to unblock the ears, such as yawning or chewing gum. More severe cases are treated with medicines or procedures. This information is not intended to replace advice given to you by your health care provider. Make sure you discuss any questions you have with your health care provider. Document Revised: 11/16/2020 Document Reviewed: 11/16/2020 Elsevier Patient Education  2024 Elsevier Inc.  

## 2023-08-09 ENCOUNTER — Ambulatory Visit: Payer: Medicare PPO | Admitting: Internal Medicine

## 2023-08-21 ENCOUNTER — Ambulatory Visit: Payer: Medicare PPO | Admitting: Internal Medicine

## 2023-08-21 ENCOUNTER — Encounter: Payer: Self-pay | Admitting: Internal Medicine

## 2023-08-21 VITALS — BP 110/70 | Ht 67.0 in | Wt 159.2 lb

## 2023-08-21 DIAGNOSIS — R739 Hyperglycemia, unspecified: Secondary | ICD-10-CM | POA: Diagnosis not present

## 2023-08-21 DIAGNOSIS — R220 Localized swelling, mass and lump, head: Secondary | ICD-10-CM | POA: Diagnosis not present

## 2023-08-21 DIAGNOSIS — E781 Pure hyperglyceridemia: Secondary | ICD-10-CM

## 2023-08-21 DIAGNOSIS — Z0001 Encounter for general adult medical examination with abnormal findings: Secondary | ICD-10-CM

## 2023-08-21 NOTE — Patient Instructions (Signed)
Health Maintenance for Postmenopausal Women Menopause is a normal process in which your ability to get pregnant comes to an end. This process happens slowly over many months or years, usually between the ages of 48 and 55. Menopause is complete when you have missed your menstrual period for 12 months. It is important to talk with your health care provider about some of the most common conditions that affect women after menopause (postmenopausal women). These include heart disease, cancer, and bone loss (osteoporosis). Adopting a healthy lifestyle and getting preventive care can help to promote your health and wellness. The actions you take can also lower your chances of developing some of these common conditions. What are the signs and symptoms of menopause? During menopause, you may have the following symptoms: Hot flashes. These can be moderate or severe. Night sweats. Decrease in sex drive. Mood swings. Headaches. Tiredness (fatigue). Irritability. Memory problems. Problems falling asleep or staying asleep. Talk with your health care provider about treatment options for your symptoms. Do I need hormone replacement therapy? Hormone replacement therapy is effective in treating symptoms that are caused by menopause, such as hot flashes and night sweats. Hormone replacement carries certain risks, especially as you become older. If you are thinking about using estrogen or estrogen with progestin, discuss the benefits and risks with your health care provider. How can I reduce my risk for heart disease and stroke? The risk of heart disease, heart attack, and stroke increases as you age. One of the causes may be a change in the body's hormones during menopause. This can affect how your body uses dietary fats, triglycerides, and cholesterol. Heart attack and stroke are medical emergencies. There are many things that you can do to help prevent heart disease and stroke. Watch your blood pressure High  blood pressure causes heart disease and increases the risk of stroke. This is more likely to develop in people who have high blood pressure readings or are overweight. Have your blood pressure checked: Every 3-5 years if you are 18-39 years of age. Every year if you are 40 years old or older. Eat a healthy diet  Eat a diet that includes plenty of vegetables, fruits, low-fat dairy products, and lean protein. Do not eat a lot of foods that are high in solid fats, added sugars, or sodium. Get regular exercise Get regular exercise. This is one of the most important things you can do for your health. Most adults should: Try to exercise for at least 150 minutes each week. The exercise should increase your heart rate and make you sweat (moderate-intensity exercise). Try to do strengthening exercises at least twice each week. Do these in addition to the moderate-intensity exercise. Spend less time sitting. Even light physical activity can be beneficial. Other tips Work with your health care provider to achieve or maintain a healthy weight. Do not use any products that contain nicotine or tobacco. These products include cigarettes, chewing tobacco, and vaping devices, such as e-cigarettes. If you need help quitting, ask your health care provider. Know your numbers. Ask your health care provider to check your cholesterol and your blood sugar (glucose). Continue to have your blood tested as directed by your health care provider. Do I need screening for cancer? Depending on your health history and family history, you may need to have cancer screenings at different stages of your life. This may include screening for: Breast cancer. Cervical cancer. Lung cancer. Colorectal cancer. What is my risk for osteoporosis? After menopause, you may be   at increased risk for osteoporosis. Osteoporosis is a condition in which bone destruction happens more quickly than new bone creation. To help prevent osteoporosis or  the bone fractures that can happen because of osteoporosis, you may take the following actions: If you are 19-50 years old, get at least 1,000 mg of calcium and at least 600 international units (IU) of vitamin D per day. If you are older than age 50 but younger than age 70, get at least 1,200 mg of calcium and at least 600 international units (IU) of vitamin D per day. If you are older than age 70, get at least 1,200 mg of calcium and at least 800 international units (IU) of vitamin D per day. Smoking and drinking excessive alcohol increase the risk of osteoporosis. Eat foods that are rich in calcium and vitamin D, and do weight-bearing exercises several times each week as directed by your health care provider. How does menopause affect my mental health? Depression may occur at any age, but it is more common as you become older. Common symptoms of depression include: Feeling depressed. Changes in sleep patterns. Changes in appetite or eating patterns. Feeling an overall lack of motivation or enjoyment of activities that you previously enjoyed. Frequent crying spells. Talk with your health care provider if you think that you are experiencing any of these symptoms. General instructions See your health care provider for regular wellness exams and vaccines. This may include: Scheduling regular health, dental, and eye exams. Getting and maintaining your vaccines. These include: Influenza vaccine. Get this vaccine each year before the flu season begins. Pneumonia vaccine. Shingles vaccine. Tetanus, diphtheria, and pertussis (Tdap) booster vaccine. Your health care provider may also recommend other immunizations. Tell your health care provider if you have ever been abused or do not feel safe at home. Summary Menopause is a normal process in which your ability to get pregnant comes to an end. This condition causes hot flashes, night sweats, decreased interest in sex, mood swings, headaches, or lack  of sleep. Treatment for this condition may include hormone replacement therapy. Take actions to keep yourself healthy, including exercising regularly, eating a healthy diet, watching your weight, and checking your blood pressure and blood sugar levels. Get screened for cancer and depression. Make sure that you are up to date with all your vaccines. This information is not intended to replace advice given to you by your health care provider. Make sure you discuss any questions you have with your health care provider. Document Revised: 01/25/2021 Document Reviewed: 01/25/2021 Elsevier Patient Education  2024 Else[employer] Inc.  

## 2023-08-21 NOTE — Progress Notes (Signed)
Subjective:    Patient ID: Kristine Le, female    DOB: 1940-11-09, 82 y.o.   MRN: [id number]  HPI  Patient presents to clinic today for her annual exam.    Flu: 06/2023 Tetanus: > 10 years ago COVID: never Pneumovax: 06/2014 Prevnar: 12/2017 Shingrix: 12/2017, 06/2018 Pap smear: Hysterectomy Mammogram: 02/2015 Bone density: never Colon screening: 05/2015 Vision screening: as needed Dentist: biannually  Diet: She does eat meat. She consumes fruits and veggies. She does eat some fried foods. She drinks mostly water, some soda. Exercise: None  Review of Systems  Past Medical History:  Diagnosis Date   Allergy    CHF (congestive heart failure) (HCC)    Colon polyps    COPD (chronic obstructive pulmonary disease) (HCC)    PONV (postoperative nausea and vomiting)     Current Outpatient Medications  Medication Sig Dispense Refill   aspirin EC 81 MG tablet Take 1 tablet (81 mg total) by mouth daily. Swallow whole. (Patient not taking: Reported on 08/08/2023) 30 tablet 12   B Complex-C-E-Zn (B COMPLEX-C-E-ZINC) tablet Take 1 tablet by mouth daily.     cetirizine (ZYRTEC) 10 MG tablet Take 10 mg by mouth daily.     Cholecalciferol (VITAMIN D3) 1000 units CAPS Take by mouth.     Zinc Sulfate (ZINC 15 PO) Take by mouth.     No current facility-administered medications for this visit.    Allergies  Allergen Reactions   Codeine Nausea And Vomiting    Pt does not want to take any additional codeine products.    Family History  Problem Relation Age of Onset   Asthma Mother    Kidney cancer Father    Arthritis Sister    GER disease Sister    Diabetes Sister    Bone cancer Son    Kidney cancer Son     Social History   Socioeconomic History   Marital status: Widowed    Spouse name: Not on file   Number of children: Not on file   Years of education: Not on file   Highest education level: High school graduate  Occupational History   Occupation: retired  Tobacco Use    Smoking status: Every Day    Current packs/day: 1.00    Average packs/day: 1 pack/day for 53.0 years (53.0 ttl pk-yrs)    Types: Cigarettes   Smokeless tobacco: Never   Tobacco comments:    she quit for 8 years in 1980s and would like to quit.  Meds not effective in past.  Vaping Use   Vaping status: Never Used  Substance and Sexual Activity   Alcohol use: Yes    Alcohol/week: 2.0 standard drinks of alcohol    Types: 2 Cans of beer per week    Comment: occasionally   Drug use: No   Sexual activity: Not on file  Other Topics Concern   Not on file  Social History Narrative   Pt lives alone   Social Determinants of Health   Financial Resource Strain: High Risk (08/08/2023)   Overall Financial Resource Strain (CARDIA)    Difficulty of Paying Living Expenses: Hard  Food Insecurity: Food Insecurity Present (08/08/2023)   Hunger Vital Sign    Worried About Running Out of Food in the Last Year: Sometimes true    Ran Out of Food in the Last Year: Never true  Transportation Needs: No Transportation Needs (08/08/2023)   PRAPARE - Administrator, Civil Service (Medical): No  Lack of Transportation (Non-Medical): No  Physical Activity: Inactive (08/08/2023)   Exercise Vital Sign    Days of Exercise per Week: 0 days    Minutes of Exercise per Session: 0 min  Stress: No Stress Concern Present (08/08/2023)   [employer] of Occupational Health - Occupational Stress Questionnaire    Feeling of Stress : Not at all  Social Connections: Moderately Isolated (08/08/2023)   Social Connection and Isolation Panel [NHANES]    Frequency of Communication with Friends and Family: More than three times a week    Frequency of Social Gatherings with Friends and Family: Three times a week    Attends Religious Services: 1 to 4 times per year    Active Member of Clubs or Organizations: No    Attends Banker Meetings: Never    Marital Status: Widowed  Intimate Partner  Violence: Not At Risk (08/08/2023)   Humiliation, Afraid, Rape, and Kick questionnaire    Fear of Current or Ex-Partner: No    Emotionally Abused: No    Physically Abused: No    Sexually Abused: No     Constitutional: Denies fever, malaise, fatigue, headache or abrupt weight changes.  HEENT: Denies eye pain, eye redness, ear pain, ringing in the ears, wax buildup, runny nose, nasal congestion, bloody nose, or sore throat. Respiratory: Patient reports chronic cough.  Denies difficulty breathing, shortness of breath, or sputum production.   Cardiovascular: Denies chest pain, chest tightness, palpitations or swelling in the hands or feet.  Gastrointestinal: Denies abdominal pain, bloating, constipation, diarrhea or blood in the stool.  GU: Patient reports stress incontinence.  Denies urgency, frequency, pain with urination, burning sensation, blood in urine, odor or discharge. Musculoskeletal: Denies decrease in range of motion, difficulty with gait, muscle pain or joint pain and swelling.  Skin: Denies redness, rashes, lesions or ulcercations.  Neurological: Denies dizziness, difficulty with memory, difficulty with speech or problems with balance and coordination.  Psych: Denies anxiety, depression, SI/HI.  No other specific complaints in a complete review of systems (except as listed in HPI above).     Objective:   Physical Exam  BP 110/70   Ht 5\' 7"  (1.702 m)   Wt 159 lb 3.2 oz (72.2 kg)   BMI 24.93 kg/m    Wt Readings from Last 3 Encounters:  08/08/23 155 lb 6.4 oz (70.5 kg)  02/10/23 152 lb (68.9 kg)  12/14/22 152 lb 9.6 oz (69.2 kg)    General: Appears her stated age, well developed, well nourished in NAD. Skin: Warm, dry and intact. HEENT: Head: normal shape and size; Eyes: sclera white, no icterus, conjunctiva pink, PERRLA and EOMs intact; she has a preauricular 3 cm mobile mass noted on the right. Neck:  Neck supple, trachea midline. No masses, lumps or thyromegaly  present.  Cardiovascular: Normal rate and rhythm. S1,S2 noted.  Murmur noted. No JVD or BLE edema. No carotid bruits noted. Pulmonary/Chest: Normal effort and positive vesicular breath sounds. No respiratory distress. No wheezes, rales or ronchi noted.  Abdomen: Soft and nontender. Normal bowel sounds.  Musculoskeletal: Strength 5/5 BUE/BLE.  No difficulty with gait.  Neurological: Alert and oriented. Cranial nerves II-XII grossly intact. Coordination normal.  Psychiatric: Mood and affect normal. Behavior is normal. Judgment and thought content normal.    BMET    Component Value Date/Time   NA 142 10/10/2022 1012   NA 140 01/09/2015 1123   K 3.6 10/10/2022 1012   K 3.9 01/09/2015 1123   CL 107  10/10/2022 1012   CL 105 01/09/2015 1123   CO2 27 10/10/2022 1012   CO2 28 01/09/2015 1123   GLUCOSE 96 10/10/2022 1012   GLUCOSE 100 (H) 01/09/2015 1123   BUN 15 10/10/2022 1012   BUN 12 01/09/2015 1123   CREATININE 0.73 10/10/2022 1012   CALCIUM 8.9 10/10/2022 1012   CALCIUM 9.4 01/09/2015 1123   GFRNONAA 30 (L) 09/04/2022 2017   GFRNONAA 70 03/30/2020 0850   GFRAA 81 03/30/2020 0850    Lipid Panel     Component Value Date/Time   CHOL 133 10/10/2022 1012   TRIG 246 (H) 10/10/2022 1012   HDL 37 (L) 10/10/2022 1012   CHOLHDL 3.6 10/10/2022 1012   LDLCALC 65 10/10/2022 1012    CBC    Component Value Date/Time   WBC 6.0 10/10/2022 1012   RBC 3.75 (L) 10/10/2022 1012   HGB 12.5 10/10/2022 1012   HGB 14.7 01/09/2015 1123   HCT 36.8 10/10/2022 1012   HCT 43.0 01/09/2015 1123   PLT 136 (L) 10/10/2022 1012   PLT 128 (L) 01/09/2015 1123   MCV 98.1 10/10/2022 1012   MCV 97 01/09/2015 1123   MCH 33.3 (H) 10/10/2022 1012   MCHC 34.0 10/10/2022 1012   RDW 12.3 10/10/2022 1012   RDW 12.4 01/09/2015 1123   LYMPHSABS 2,153 03/30/2020 0850   LYMPHSABS 2.2 01/09/2015 1123   MONOABS 0.5 01/09/2015 1123   EOSABS 262 03/30/2020 0850   EOSABS 0.3 01/09/2015 1123   BASOSABS 28  03/30/2020 0850   BASOSABS 0.0 01/09/2015 1123   BASOSABS 1 01/09/2015 1123    Hgb A1C Lab Results  Component Value Date   HGBA1C 5.1 03/30/2020            Assessment & Plan:   Preventative Health Maintenance:  Flu shot UTD She declines tetanus for financial reasons, advised her if she gets bit or cut to go have this done Encouraged her to get her COVID-vaccine She declines Prevnar 20 today, Pneumovax and Prevnar 13 UTD Shingrix UTD She no longer needs Pap smears She no longer wants to screen for breast cancer She declines osteoporosis screening She no longer wants to screen for colon cancer She no longer wants to screen for lung cancer Encouraged her to consume a balanced diet and exercise regimen Advised her to see an eye doctor and dentist annually Declines labs today   Mass of preauricular region: RTC in 6 months, follow-up chronic conditions Nicki Reaper, NP

## 2023-08-23 ENCOUNTER — Ambulatory Visit: Admission: RE | Admit: 2023-08-23 | Payer: Medicare PPO | Source: Ambulatory Visit

## 2024-02-16 ENCOUNTER — Ambulatory Visit: Payer: Medicare PPO

## 2024-02-16 DIAGNOSIS — Z0000 Encounter for general adult medical examination without abnormal findings: Secondary | ICD-10-CM

## 2024-02-16 NOTE — Progress Notes (Signed)
 Subjective:   Kristine Le is a 83 y.o. who presents for a Medicare Wellness preventive visit.  As a reminder, Annual Wellness Visits don't include a physical exam, and some assessments may be limited, especially if this visit is performed virtually. We may recommend an in-person follow-up visit with your provider if needed.  Visit Complete: Virtual I connected with  [name] on 02/16/24 by a audio enabled telemedicine application and verified that I am speaking with the correct person using two identifiers.  Patient Location: Home  Provider Location: Home Office  I discussed the limitations of evaluation and management by telemedicine. The patient expressed understanding and agreed to proceed.  Vital Signs: Because this visit was a virtual/telehealth visit, some criteria may be missing or patient reported. Any vitals not documented were not able to be obtained and vitals that have been documented are patient reported.  VideoDeclined- This patient declined Librarian, academic. Therefore the visit was completed with audio only.  Persons Participating in Visit: Patient.  AWV Questionnaire: No: Patient Medicare AWV questionnaire was not completed prior to this visit.  Cardiac Risk Factors include: advanced age (>34men, >48 women);dyslipidemia;smoking/ tobacco exposure     Objective:     There were no vitals filed for this visit. There is no height or weight on file to calculate BMI.     02/16/2024    8:18 AM 02/10/2023    9:07 AM 09/04/2022    8:17 PM 02/04/2022    9:24 AM 02/02/2021    9:45 AM 09/09/2020    7:53 AM 01/22/2019   11:02 AM  Advanced Directives  Does Patient Have a Medical Advance Directive? No No No No No No No  Would patient like information on creating a medical advance directive? No - Patient declined No - Patient declined  No - Patient declined  No - Patient declined Yes (MAU/Ambulatory/Procedural Areas - Information given)     Current Medications (verified) Outpatient Encounter Medications as of 02/16/2024  Medication Sig   B Complex-C-E-Zn (B COMPLEX-C-E-ZINC) tablet Take 1 tablet by mouth daily.   Biotin 10 MG CAPS Take by mouth daily.   cetirizine (ZYRTEC) 10 MG tablet Take 10 mg by mouth daily.   Cholecalciferol (VITAMIN D3) 1000 units CAPS Take by mouth.   Zinc Sulfate (ZINC 15 PO) Take by mouth.   aspirin  EC 81 MG tablet Take 1 tablet (81 mg total) by mouth daily. Swallow whole. (Patient not taking: Reported on 02/16/2024)   No facility-administered encounter medications on file as of 02/16/2024.    Allergies (verified) Codeine   History: Past Medical History:  Diagnosis Date   Allergy    CHF (congestive heart failure) (HCC)    Colon polyps    COPD (chronic obstructive pulmonary disease) (HCC)    PONV (postoperative nausea and vomiting)    Past Surgical History:  Procedure Laterality Date   CATARACT EXTRACTION W/PHACO Left 09/09/2020   Procedure: CATARACT EXTRACTION PHACO AND INTRAOCULAR LENS PLACEMENT (IOC) LEFT 8.52 01:05.1 13.1%;  Surgeon: Annell Kidney, MD;  Location: [facility];  Service: Ophthalmology;  Laterality: Left;   KNEE SURGERY     PARTIAL HYSTERECTOMY     THUMB ARTHROSCOPY     Family History  Problem Relation Age of Onset   Asthma Mother    Kidney cancer Father    Arthritis Sister    GER disease Sister    Diabetes Sister    Bone cancer Son    Kidney cancer Son  Social History   Socioeconomic History   Marital status: Widowed    Spouse name: Not on file   Number of children: Not on file   Years of education: Not on file   Highest education level: High school graduate  Occupational History   Occupation: retired  Tobacco Use   Smoking status: Every Day    Current packs/day: 1.00    Average packs/day: 1 pack/day for 53.0 years (53.0 ttl pk-yrs)    Types: Cigarettes   Smokeless tobacco: Never   Tobacco comments:    she quit for 8 years in  1980s and would like to quit.  Meds not effective in past.  Vaping Use   Vaping status: Never Used  Substance and Sexual Activity   Alcohol use: Yes    Alcohol/week: 2.0 standard drinks of alcohol    Types: 2 Cans of beer per week    Comment: occasionally   Drug use: No   Sexual activity: Not on file  Other Topics Concern   Not on file  Social History Narrative   Pt lives alone   Social Drivers of Health   Financial Resource Strain: Low Risk  (02/16/2024)   Overall Financial Resource Strain (CARDIA)    Difficulty of Paying Living Expenses: Not very hard  Food Insecurity: No Food Insecurity (02/16/2024)   Hunger Vital Sign    Worried About Running Out of Food in the Last Year: Never true    Ran Out of Food in the Last Year: Never true  Transportation Needs: No Transportation Needs (02/16/2024)   PRAPARE - Administrator, Civil Service (Medical): No    Lack of Transportation (Non-Medical): No  Physical Activity: Insufficiently Active (02/16/2024)   Exercise Vital Sign    Days of Exercise per Week: 7 days    Minutes of Exercise per Session: 20 min  Stress: No Stress Concern Present (02/16/2024)   [employer] of Occupational Health - Occupational Stress Questionnaire    Feeling of Stress : Only a little  Social Connections: Socially Isolated (02/16/2024)   Social Connection and Isolation Panel [NHANES]    Frequency of Communication with Friends and Family: More than three times a week    Frequency of Social Gatherings with Friends and Family: Once a week    Attends Religious Services: Never    Database administrator or Organizations: No    Attends Banker Meetings: Never    Marital Status: Widowed    Tobacco Counseling Ready to quit: Not Answered Counseling given: Not Answered Tobacco comments: she quit for 8 years in 1980s and would like to quit.  Meds not effective in past.    Clinical Intake:  Pre-visit preparation completed: Yes  Pain :  No/denies pain     BMI - recorded: 24.9 Nutritional Status: BMI of 19-24  Normal Nutritional Risks: None Diabetes: No  Lab Results  Component Value Date   HGBA1C 5.1 03/30/2020     How often do you need to have someone help you when you read instructions, pamphlets, or other written materials from your doctor or pharmacy?: 1 - Never  Interpreter Needed?: No  Information entered by :: Dellie Fergusson, LPN   Activities of Daily Living    02/16/2024    8:20 AM  In your present state of health, do you have any difficulty performing the following activities:  Hearing? 0  Vision? 0  Difficulty concentrating or making decisions? 0  Walking or climbing stairs? 0  Dressing  or bathing? 0  Doing errands, shopping? 0  Preparing Food and eating ? N  Using the Toilet? N  In the past six months, have you accidently leaked urine? N  Do you have problems with loss of bowel control? N  Managing your Medications? N  Managing your Finances? N  Housekeeping or managing your Housekeeping? N    Patient Care Team: Carollynn Cirri, NP as PCP - General (Internal Medicine) Lydia Sams Florentina Huntsman, DPM as Consulting Physician (Podiatry) Spencer Dy, OD (Optometry)  Indicate any recent Medical Services you may have received from other than [facility] providers in the past year (date may be approximate).     Assessment:    This is a routine wellness examination for Jeffers.  Hearing/Vision screen Hearing Screening - Comments:: NO AIDS Vision Screening - Comments:: WEARS GLASSES ALL DAY-DR.SHADE   Goals Addressed             This Visit's Progress    DIET - INCREASE WATER INTAKE         Depression Screen     02/16/2024    8:15 AM 08/08/2023   10:29 AM 02/10/2023    9:06 AM 10/10/2022   10:12 AM 09/09/2022    9:23 AM 04/04/2022    9:37 AM 02/04/2022    9:22 AM  PHQ 2/9 Scores  PHQ - 2 Score 0 0 0 0 0 0 0  PHQ- 9 Score 0  0   0     Fall Risk     02/16/2024    8:20 AM 02/10/2023    9:08 AM  10/10/2022   10:12 AM 09/09/2022    9:23 AM 04/04/2022    9:37 AM  Fall Risk   Falls in the past year? 0 0 0 0 0  Number falls in past yr: 0 0   0  Injury with Fall? 0 0 0 0 0  Risk for fall due to : No Fall Risks No Fall Risks No Fall Risks  No Fall Risks  Follow up Falls evaluation completed Falls prevention discussed;Falls evaluation completed   Falls evaluation completed    MEDICARE RISK AT HOME:  Medicare Risk at Home Any stairs in or around the home?: Yes If so, are there any without handrails?: No Home free of loose throw rugs in walkways, pet beds, electrical cords, etc?: Yes Adequate lighting in your home to reduce risk of falls?: Yes Life alert?: No Use of a cane, walker or w/c?: No Grab bars in the bathroom?: No Shower chair or bench in shower?: No Elevated toilet seat or a handicapped toilet?: No  TIMED UP AND GO:  Was the test performed?  No  Cognitive Function: 6CIT completed        02/16/2024    8:22 AM 02/10/2023    9:12 AM 02/02/2021    9:50 AM 01/22/2019   11:02 AM  6CIT Screen  What Year? 0 points 0 points 0 points 0 points  What month? 0 points 0 points 0 points 0 points  What time? 0 points 0 points 0 points 0 points  Count back from 20 0 points 0 points 4 points 0 points  Months in reverse 0 points 0 points 0 points 0 points  Repeat phrase 0 points 0 points 2 points 0 points  Total Score 0 points 0 points 6 points 0 points    Immunizations Immunization History  Administered Date(s) Administered   Fluad Quad(high Dose 65+) 06/27/2022   Fluad Trivalent(High Dose 65+)  06/20/2016   Influenza Split 06/24/2014   Influenza,inj,Quad PF,6+ Mos 07/06/2018   Influenza-Unspecified 06/07/2012, 06/24/2015, 06/20/2016, 06/27/2017, 06/20/2023, 06/24/2023   Pneumococcal Conjugate-13 12/27/2017   Pneumococcal Polysaccharide-23 06/24/2014   Zoster Recombinant(Shingrix ) 01/05/2018, 07/06/2018    Screening Tests Health Maintenance  Topic Date Due    DTaP/Tdap/Td (1 - Tdap) Never done   COVID-19 Vaccine (1 - 2024-25 season) Never done   INFLUENZA VACCINE  04/19/2024   Medicare Annual Wellness (AWV)  02/15/2025   Pneumonia Vaccine 49+ Years old  Completed   Zoster Vaccines- Shingrix   Completed   HPV VACCINES  Aged Out   Meningococcal B Vaccine  Aged Out   Lung Cancer Screening  Discontinued   DEXA SCAN  Discontinued   Hepatitis C Screening  Discontinued    Health Maintenance  Health Maintenance Due  Topic Date Due   DTaP/Tdap/Td (1 - Tdap) Never done   COVID-19 Vaccine (1 - 2024-25 season) Never done   Health Maintenance Items Addressed: AGED OUT OF MAMMOGRAM & COLONOSCOPY; DECLINES LUNG CA SCREENING; NEEDS TDAP & PNA- WANTS NO COVID SHOTS  Additional Screening:  Vision Screening: Recommended annual ophthalmology exams for early detection of glaucoma and other disorders of the eye.  Dental Screening: Recommended annual dental exams for proper oral hygiene  Community Resource Referral / Chronic Care Management: CRR required this visit?  No   CCM required this visit?  No   Plan:    I have personally reviewed and noted the following in the patient's chart:   Medical and social history Use of alcohol, tobacco or illicit drugs  Current medications and supplements including opioid prescriptions. Patient is not currently taking opioid prescriptions. Functional ability and status Nutritional status Physical activity Advanced directives List of other physicians Hospitalizations, surgeries, and ER visits in previous 12 months Vitals Screenings to include cognitive, depression, and falls Referrals and appointments  In addition, I have reviewed and discussed with patient certain preventive protocols, quality metrics, and best practice recommendations. A written personalized care plan for preventive services as well as general preventive health recommendations were provided to patient.   Pinky Bright, LPN   1/61/0960    After Visit Summary: (MyChart) Due to this being a telephonic visit, the after visit summary with patients personalized plan was offered to patient via MyChart   Notes: Nothing significant to report at this time.

## 2024-02-16 NOTE — Patient Instructions (Signed)
 Kristine Le , Thank you for taking time out of your busy schedule to complete your Annual Wellness Visit with me. I enjoyed our conversation and look forward to speaking with you again next year. I, as well as your care team,  appreciate your ongoing commitment to your health goals. Please review the following plan we discussed and let me know if I can assist you in the future.  Follow up Visits: Next Medicare AWV with our clinical staff: 02/21/25 @ 8:10 AM BY PHONE   Have you seen your provider in the last 6 months (3 months if uncontrolled diabetes)? Yes   Clinician Recommendations:  Aim for 30 minutes of exercise or brisk walking, 6-8 glasses of water, and 5 servings of fruits and vegetables each day. TAKE CARE!      This is a list of the screening recommended for you and due dates:  Health Maintenance  Topic Date Due   DTaP/Tdap/Td vaccine (1 - Tdap) Never done   COVID-19 Vaccine (1 - 2024-25 season) Never done   Flu Shot  04/19/2024   Medicare Annual Wellness Visit  02/15/2025   Pneumonia Vaccine  Completed   Zoster (Shingles) Vaccine  Completed   HPV Vaccine  Aged Out   Meningitis B Vaccine  Aged Out   Screening for Lung Cancer  Discontinued   DEXA scan (bone density measurement)  Discontinued   Hepatitis C Screening  Discontinued    Advanced directives: (ACP Link)Information on Advanced Care Planning can be found at [place]  [employer] Advance Health Care Directives Advance Health Care Directives. [email or web address]  Advance Care Planning is important because it:  [x]  Makes sure you receive the medical care that is consistent with your values, goals, and preferences  [x]  It provides guidance to your family and loved ones and reduces their decisional burden about whether or not they are making the right decisions based on your wishes.  Follow the link provided in your after visit summary or read over the paperwork we have mailed to you to help you started getting your Advance  Directives in place. If you need assistance in completing these, please reach out to us  so that we can help you!

## 2024-02-19 ENCOUNTER — Ambulatory Visit (INDEPENDENT_AMBULATORY_CARE_PROVIDER_SITE_OTHER): Payer: Self-pay | Admitting: Internal Medicine

## 2024-02-19 ENCOUNTER — Encounter: Payer: Self-pay | Admitting: Internal Medicine

## 2024-02-19 VITALS — BP 128/72 | Ht 67.0 in | Wt 159.2 lb

## 2024-02-19 DIAGNOSIS — I700 Atherosclerosis of aorta: Secondary | ICD-10-CM | POA: Diagnosis not present

## 2024-02-19 DIAGNOSIS — J410 Simple chronic bronchitis: Secondary | ICD-10-CM | POA: Diagnosis not present

## 2024-02-19 DIAGNOSIS — N393 Stress incontinence (female) (male): Secondary | ICD-10-CM

## 2024-02-19 DIAGNOSIS — E781 Pure hyperglyceridemia: Secondary | ICD-10-CM | POA: Diagnosis not present

## 2024-02-19 NOTE — Assessment & Plan Note (Signed)
 Encouraged timed voiding Discussed Kegel exercises Continue use of pads

## 2024-02-19 NOTE — Assessment & Plan Note (Signed)
 Encourage smoking cessation She refuses lung cancer screening She declines starting any inhalers to prevent further decline of her COPD at this time

## 2024-02-19 NOTE — Progress Notes (Signed)
 Subjective:    Patient ID: Kristine Le, female    DOB: Dec 05, 1940, 83 y.o.   MRN: [id number]  HPI  Patient presents to clinic today for 70-month follow-up of chronic conditions.  COPD: She reports chronic cough but denies shortness of breath.  She is not using any inhalers at this time.  There are no PFTs on file. She does continue to smoke.  HLD with aortic atherosclerosis: Her last LDL was 65, triglycerides 213, 09/2022.  She is not taking any cholesterol-lowering medication at this time.  She is not taking aspirin .  She does not consume a low-fat diet.  Stress incontinence: She manages with the use of pads.  She does not follow with urology.  Thrombocytopenia: Her last platelet count was 136, 09/2022.  She does not follow with hematology.  Review of Systems     Past Medical History:  Diagnosis Date   Allergy    CHF (congestive heart failure) (HCC)    Colon polyps    COPD (chronic obstructive pulmonary disease) (HCC)    PONV (postoperative nausea and vomiting)     Current Outpatient Medications  Medication Sig Dispense Refill   aspirin  EC 81 MG tablet Take 1 tablet (81 mg total) by mouth daily. Swallow whole. (Patient not taking: Reported on 02/16/2024) 30 tablet 12   B Complex-C-E-Zn (B COMPLEX-C-E-ZINC) tablet Take 1 tablet by mouth daily.     Biotin 10 MG CAPS Take by mouth daily.     cetirizine (ZYRTEC) 10 MG tablet Take 10 mg by mouth daily.     Cholecalciferol (VITAMIN D3) 1000 units CAPS Take by mouth.     Zinc Sulfate (ZINC 15 PO) Take by mouth.     No current facility-administered medications for this visit.    Allergies  Allergen Reactions   Codeine Nausea And Vomiting    Pt does not want to take any additional codeine products.    Family History  Problem Relation Age of Onset   Asthma Mother    Kidney cancer Father    Arthritis Sister    GER disease Sister    Diabetes Sister    Bone cancer Son    Kidney cancer Son     Social History    Socioeconomic History   Marital status: Widowed    Spouse name: Not on file   Number of children: Not on file   Years of education: Not on file   Highest education level: High school graduate  Occupational History   Occupation: retired  Tobacco Use   Smoking status: Every Day    Current packs/day: 1.00    Average packs/day: 1 pack/day for 53.0 years (53.0 ttl pk-yrs)    Types: Cigarettes   Smokeless tobacco: Never   Tobacco comments:    she quit for 8 years in 1980s and would like to quit.  Meds not effective in past.  Vaping Use   Vaping status: Never Used  Substance and Sexual Activity   Alcohol use: Yes    Alcohol/week: 2.0 standard drinks of alcohol    Types: 2 Cans of beer per week    Comment: occasionally   Drug use: No   Sexual activity: Not on file  Other Topics Concern   Not on file  Social History Narrative   Pt lives alone   Social Drivers of Health   Financial Resource Strain: Low Risk  (02/16/2024)   Overall Financial Resource Strain (CARDIA)    Difficulty of Paying Living Expenses: Not very hard  Food Insecurity: No Food Insecurity (02/16/2024)   Hunger Vital Sign    Worried About Running Out of Food in the Last Year: Never true    Ran Out of Food in the Last Year: Never true  Transportation Needs: No Transportation Needs (02/16/2024)   PRAPARE - Administrator, Civil Service (Medical): No    Lack of Transportation (Non-Medical): No  Physical Activity: Insufficiently Active (02/16/2024)   Exercise Vital Sign    Days of Exercise per Week: 7 days    Minutes of Exercise per Session: 20 min  Stress: No Stress Concern Present (02/16/2024)   [employer] of Occupational Health - Occupational Stress Questionnaire    Feeling of Stress : Only a little  Social Connections: Socially Isolated (02/16/2024)   Social Connection and Isolation Panel [NHANES]    Frequency of Communication with Friends and Family: More than three times a week    Frequency  of Social Gatherings with Friends and Family: Once a week    Attends Religious Services: Never    Database administrator or Organizations: No    Attends Banker Meetings: Never    Marital Status: Widowed  Intimate Partner Violence: Not At Risk (02/16/2024)   Humiliation, Afraid, Rape, and Kick questionnaire    Fear of Current or Ex-Partner: No    Emotionally Abused: No    Physically Abused: No    Sexually Abused: No     Constitutional: Denies fever, malaise, fatigue, headache or abrupt weight changes.  HEENT: Denies eye pain, eye redness, ear pain, ringing in the ears, wax buildup, runny nose, nasal congestion, bloody nose, or sore throat. Respiratory: Patient reports chronic cough.  Denies difficulty breathing, shortness of breath, or sputum production.   Cardiovascular: Denies chest pain, chest tightness, palpitations or swelling in the hands or feet.  Gastrointestinal: Denies abdominal pain, bloating, constipation, diarrhea or blood in the stool.  GU: Patient reports stress incontinence.  Denies urgency, frequency, pain with urination, burning sensation, blood in urine, odor or discharge. Musculoskeletal: Denies decrease in range of motion, difficulty with gait, muscle pain or joint pain and swelling.  Skin: Denies redness, rashes, lesions or ulcercations.  Neurological: Denies dizziness, difficulty with memory, difficulty with speech or problems with balance and coordination.  Psych: Denies anxiety, depression, SI/HI.  No other specific complaints in a complete review of systems (except as listed in HPI above).  Objective:   Physical Exam BP 128/72 (BP Location: Left Arm, Patient Position: Sitting, Cuff Size: Normal)   Ht 5\' 7"  (1.702 m)   Wt 159 lb 3.2 oz (72.2 kg)   BMI 24.93 kg/m    Wt Readings from Last 3 Encounters:  08/21/23 159 lb 3.2 oz (72.2 kg)  08/08/23 155 lb 6.4 oz (70.5 kg)  02/10/23 152 lb (68.9 kg)    General: Appears her stated age, well  developed, well nourished in NAD. Skin: Warm, dry and intact.  HEENT: Head: normal shape and size; Eyes: sclera white, no icterus, conjunctiva pink, PERRLA and EOMs intact;  Cardiovascular: Normal rate and rhythm. S1,S2 noted.  No murmur, rubs or gallops noted. No JVD or BLE edema. No carotid bruits noted. Pulmonary/Chest: Normal effort and diminished vesicular breath sounds. No respiratory distress. No wheezes, rales or ronchi noted.  Musculoskeletal:  No difficulty with gait.  Neurological: Alert and oriented.  Coordination normal.    BMET    Component Value Date/Time   NA 142 10/10/2022 1012   NA 140 01/09/2015 1123  K 3.6 10/10/2022 1012   K 3.9 01/09/2015 1123   CL 107 10/10/2022 1012   CL 105 01/09/2015 1123   CO2 27 10/10/2022 1012   CO2 28 01/09/2015 1123   GLUCOSE 96 10/10/2022 1012   GLUCOSE 100 (H) 01/09/2015 1123   BUN 15 10/10/2022 1012   BUN 12 01/09/2015 1123   CREATININE 0.73 10/10/2022 1012   CALCIUM 8.9 10/10/2022 1012   CALCIUM 9.4 01/09/2015 1123   GFRNONAA 30 (L) 09/04/2022 2017   GFRNONAA 70 03/30/2020 0850   GFRAA 81 03/30/2020 0850    Lipid Panel     Component Value Date/Time   CHOL 133 10/10/2022 1012   TRIG 246 (H) 10/10/2022 1012   HDL 37 (L) 10/10/2022 1012   CHOLHDL 3.6 10/10/2022 1012   LDLCALC 65 10/10/2022 1012    CBC    Component Value Date/Time   WBC 6.0 10/10/2022 1012   RBC 3.75 (L) 10/10/2022 1012   HGB 12.5 10/10/2022 1012   HGB 14.7 01/09/2015 1123   HCT 36.8 10/10/2022 1012   HCT 43.0 01/09/2015 1123   PLT 136 (L) 10/10/2022 1012   PLT 128 (L) 01/09/2015 1123   MCV 98.1 10/10/2022 1012   MCV 97 01/09/2015 1123   MCH 33.3 (H) 10/10/2022 1012   MCHC 34.0 10/10/2022 1012   RDW 12.3 10/10/2022 1012   RDW 12.4 01/09/2015 1123   LYMPHSABS 2,153 03/30/2020 0850   LYMPHSABS 2.2 01/09/2015 1123   MONOABS 0.5 01/09/2015 1123   EOSABS 262 03/30/2020 0850   EOSABS 0.3 01/09/2015 1123   BASOSABS 28 03/30/2020 0850   BASOSABS  0.0 01/09/2015 1123   BASOSABS 1 01/09/2015 1123    Hgb A1C Lab Results  Component Value Date   HGBA1C 5.1 03/30/2020            Assessment & Plan:     RTC in 6 months for your annual exam Helayne Lo, NP

## 2024-02-19 NOTE — Assessment & Plan Note (Signed)
 She declines lipid profile today Encouraged her to consume low-fat diet Encouraged her to start taking her aspirin  81 mg daily

## 2024-02-19 NOTE — Assessment & Plan Note (Signed)
 She declines lipid profile today Encouraged her to consume low-fat diet

## 2024-02-19 NOTE — Patient Instructions (Signed)
 Atherosclerosis  Atherosclerosis is when plaque builds up in the arteries. This causes narrowing and hardening of the arteries. Arteries are blood vessels that carry blood from the heart to all parts of the body. This blood contains oxygen. Plaque occurs due to inflammation or from a buildup of fat, cholesterol, calcium, waste products of cells, and a clotting material in the blood (fibrin). Plaque decreases the amount of blood that can flow through the artery. Atherosclerosis can affect any artery in your body, including: Heart arteries. Damage to these arteries may lead to coronary artery disease, which can cause a heart attack. Brain arteries. Damage to these arteries may cause a stroke. Leg, arm, and pelvis arteries. Peripheral artery disease (PAD) may result from damage to these arteries. Kidney arteries. Kidney (renal) failure may result from damage to kidney arteries. Treatment may slow the disease and prevent further damage to your heart, brain, peripheral arteries, and kidneys. What are the causes? This condition develops slowly over many years. The inner layers of your arteries become damaged and allow the gradual buildup of plaque. The exact cause of atherosclerosis is not fully understood. Symptoms of atherosclerosis do not occur until an artery becomes narrow or blocked. What increases the risk? The following factors may make you more likely to develop this condition: Being middle-aged or older. Certain medical conditions, including: High blood pressure. High cholesterol. High blood fats (triglycerides). Diabetes. Sleep apnea. Obesity. Certain lab levels, including: Elevated C-reactive protein (CRP). This is a sign of increased inflammation in your body. Elevated homocysteine levels. This is an amino acid that is associated with heart and blood vessel disease. Using tobacco or nicotine products. A family history of atherosclerosis. Not exercising enough (sedentary  lifestyle). Being stressed. Drinking too much alcohol or using drugs, such as cocaine or methamphetamine. What are the signs or symptoms? Symptoms of atherosclerosis do not occur until the plaque severely narrows or blocks the artery, which decreases blood flow. Sometimes, atherosclerosis does not cause symptoms. Symptoms of this condition include: Coronary artery disease. This may cause chest pain and shortness of breath. Decreased blood supply to your brain, which may cause a stroke. Signs of a stroke may include sudden: Weakness or numbness in your face, arm, or leg, especially on one side of your body. Trouble walking or difficulty moving your arms or legs. Loss of balance or coordination. Confusion. Slurred speech. Trouble speaking, or trouble understanding speech, or both (aphasia). Vision changes in one or both eyes. This may be double vision, blurred vision, or loss of vision. Severe headache with no known cause. The headache is often described as the worst headache ever experienced. PAD, which may cause pain, numbness, or nonhealing wounds, often in your legs and hips. Renal failure. This may cause tiredness, problems with urination, swelling, and itchy skin. How is this diagnosed? This condition is diagnosed based on your medical history and a physical exam. During the exam, your health care provider will: Check your pulse in different places. Listen for a "whooshing" sound over your arteries (bruit). You may also have tests, such as: Blood tests to check your levels of cholesterol, triglycerides, blood sugar, and CRP. Ankle-brachial index to compare blood pressure in your arms to blood pressure in your ankles to see how your blood is flowing. Heart (cardiac) tests. Electrocardiogram (ECG) to check for heart damage. Stress test to see how your heart reacts to exercise. Ultrasound tests. Ultrasound of your peripheral arteries to check blood flow. Echocardiogram to get images of  your heart's  chambers and valves. X-ray tests. Chest X-ray to see if you have an enlarged heart, which is a sign of heart failure. CT scan to check for damage to your heart, brain, or arteries. Angiogram. This is a test where dye is injected and X-rays are used to see the blood flow in the arteries. How is this treated? This condition is treated with lifestyle changes as the first step. These may include: Changing your diet. Losing weight. Reducing stress. Exercising and being physically active more regularly. Quitting smoking. You may also need medicine to: Lower triglycerides and cholesterol. Control blood pressure. Prevent blood clots. Lower inflammation in your body. Control your blood sugar. Sometimes, surgery is needed to: Remove plaque from an artery (endarterectomy). Open or widen a narrowed heart artery or peripheral artery (angioplasty). Create a new path for your blood with one of these procedures: Heart (coronary) artery bypass graft surgery. Peripheral artery bypass graft surgery. Place a small mesh tube (stent) in an artery to open or widen a narrowed artery. Follow these instructions at home: Eating and drinking  Eat a heart-healthy diet. Talk with your health care provider or a dietitian if you need help. A heart-healthy diet involves: Limiting unhealthy fats and increasing healthy fats. Some examples of healthy fats are avocados and olive oil. Eating plant-based foods, such as fruits, vegetables, nuts, whole grains, and legumes (such as peas and lentils). If you drink alcohol: Limit how much you have to: 0-1 drink a day for women who are not pregnant. 0-2 drinks a day for men. Know how much alcohol is in a drink. In the [place] one drink equals one 12 oz bottle of beer (355 mL), one 5 oz glass of wine (148 mL), or one 1 oz glass of hard liquor (44 mL). Lifestyle  Maintain a healthy weight. Lose weight if your health care provider says that you need to do  that. Follow an exercise program as told by your health care provider. Do not use any products that contain nicotine or tobacco. These products include cigarettes, chewing tobacco, and vaping devices, such as e-cigarettes. If you need help quitting, ask your health care provider. Do not use drugs. General instructions Take over-the-counter and prescription medicines only as told by your health care provider. Manage other health conditions as told. Keep all follow-up visits. This is important. Contact a health care provider if you have: An irregular heartbeat. Unexplained tiredness (fatigue). Trouble urinating, or you are producing less urine or foamy urine. Swelling of your hands or feet, or itchy skin. Unexplained pain or numbness in your legs or hips. A wound that is slow to heal or is not healing. Get help right away if: You have any symptoms of a heart attack. These may be: Chest pain. This includes squeezing chest pain that may feel like indigestion (angina). Shortness of breath. Pain in your neck, jaw, arms, back, or stomach. Cold sweat. Nausea. Light-headedness. Sudden pain, numbness, or coldness in a limb. You have any symptoms of a stroke. "BE FAST" is an easy way to remember the main warning signs of a stroke: B - Balance. Signs are dizziness, sudden trouble walking, or loss of balance. E - Eyes. Signs are trouble seeing or a sudden change in vision. F - Face. Signs are sudden weakness or numbness of the face, or the face or eyelid drooping on one side. A - Arms. Signs are weakness or numbness in an arm. This happens suddenly and usually on one side of the body. S -  Speech. Signs are sudden trouble speaking, slurred speech, or trouble understanding what people say. T - Time. Time to call emergency services. Write down what time symptoms started. You have other signs of a stroke, such as: A sudden, severe headache with no known cause. Nausea or vomiting. Seizure. These  symptoms may represent a serious problem that is an emergency. Do not wait to see if the symptoms will go away. Get medical help right away. Call your local emergency services (911 in the [place] Do not drive yourself to the hospital. Summary Atherosclerosis is when plaque builds up in the arteries and causes narrowing and hardening of the arteries. Plaque occurs due to inflammation or from a buildup of fat, cholesterol, calcium, cellular waste products, and fibrin. This condition may not cause any symptoms. Symptoms of atherosclerosis do not occur until the plaque severely narrows or blocks the artery. Treatment starts with lifestyle changes and may include medicines. In some cases, surgery is needed. Get help right away if you have any symptoms of a heart attack or stroke. This information is not intended to replace advice given to you by your health care provider. Make sure you discuss any questions you have with your health care provider. Document Revised: 12/09/2020 Document Reviewed: 12/09/2020 Elsevier Patient Education  2024 [employer].

## 2024-08-21 ENCOUNTER — Encounter: Admitting: Internal Medicine

## 2024-10-10 ENCOUNTER — Encounter: Admitting: Internal Medicine

## 2024-10-17 ENCOUNTER — Other Ambulatory Visit: Payer: Self-pay

## 2024-10-17 ENCOUNTER — Emergency Department
Admission: EM | Admit: 2024-10-17 | Discharge: 2024-10-17 | Disposition: A | Discharge status: Home / Self Care | Attending: Emergency Medicine | Admitting: Emergency Medicine

## 2024-10-17 ENCOUNTER — Emergency Department

## 2024-10-17 DIAGNOSIS — R509 Fever, unspecified: Secondary | ICD-10-CM | POA: Diagnosis present

## 2024-10-17 DIAGNOSIS — J449 Chronic obstructive pulmonary disease, unspecified: Secondary | ICD-10-CM | POA: Diagnosis not present

## 2024-10-17 DIAGNOSIS — I509 Heart failure, unspecified: Secondary | ICD-10-CM | POA: Diagnosis not present

## 2024-10-17 DIAGNOSIS — N390 Urinary tract infection, site not specified: Secondary | ICD-10-CM | POA: Diagnosis not present

## 2024-10-17 LAB — URINALYSIS, ROUTINE W REFLEX MICROSCOPIC
Bilirubin Urine: NEGATIVE
Glucose, UA: NEGATIVE mg/dL
Ketones, ur: 5 mg/dL — AB
Nitrite: NEGATIVE
Protein, ur: 100 mg/dL — AB
Specific Gravity, Urine: 1.013 (ref 1.005–1.030)
Squamous Epithelial / HPF: 0 /HPF (ref 0–5)
WBC, UA: >50 WBC/hpf (ref 0–5)
pH: 5 (ref 5.0–8.0)

## 2024-10-17 LAB — CBC
HCT: 41 % (ref 36.0–46.0)
Hemoglobin: 13.5 g/dL (ref 12.0–15.0)
MCH: 32.1 pg (ref 26.0–34.0)
MCHC: 32.9 g/dL (ref 30.0–36.0)
MCV: 97.4 fL (ref 80.0–100.0)
Platelets: 96 10*3/uL — ABNORMAL LOW (ref 150–400)
RBC: 4.21 MIL/uL (ref 3.87–5.11)
RDW: 12.6 % (ref 11.5–15.5)
WBC: 12.5 10*3/uL — ABNORMAL HIGH (ref 4.0–10.5)
nRBC: 0 % (ref 0.0–0.2)

## 2024-10-17 LAB — COMPREHENSIVE METABOLIC PANEL WITH GFR
ALT: 18 U/L (ref 0–44)
AST: 15 U/L (ref 15–41)
Albumin: 4.2 g/dL (ref 3.5–5.0)
Alkaline Phosphatase: 52 U/L (ref 38–126)
Anion gap: 15 (ref 5–15)
BUN: 20 mg/dL (ref 8–23)
CO2: 20 mmol/L — ABNORMAL LOW (ref 22–32)
Calcium: 9.1 mg/dL (ref 8.9–10.3)
Chloride: 100 mmol/L (ref 98–111)
Creatinine, Ser: 1.06 mg/dL — ABNORMAL HIGH (ref 0.44–1.00)
GFR, Estimated: 52 mL/min — ABNORMAL LOW
Glucose, Bld: 130 mg/dL — ABNORMAL HIGH (ref 70–99)
Potassium: 4.3 mmol/L (ref 3.5–5.1)
Sodium: 135 mmol/L (ref 135–145)
Total Bilirubin: 1.3 mg/dL — ABNORMAL HIGH (ref 0.0–1.2)
Total Protein: 7 g/dL (ref 6.5–8.1)

## 2024-10-17 LAB — RESP PANEL BY RT-PCR (RSV, FLU A&B, COVID)  RVPGX2
Influenza A by PCR: NEGATIVE
Influenza B by PCR: NEGATIVE
Resp Syncytial Virus by PCR: NEGATIVE
SARS Coronavirus 2 by RT PCR: NEGATIVE

## 2024-10-17 MED ORDER — IBUPROFEN 600 MG PO TABS
600.0000 mg | ORAL_TABLET | Freq: Once | ORAL | Status: AC
  Administered 2024-10-17: 600 mg via ORAL
  Filled 2024-10-17: qty 1

## 2024-10-17 MED ORDER — SODIUM CHLORIDE 0.9 % IV BOLUS
1000.0000 mL | Freq: Once | INTRAVENOUS | Status: AC
  Administered 2024-10-17: 1000 mL via INTRAVENOUS

## 2024-10-17 MED ORDER — SODIUM CHLORIDE 0.9 % IV SOLN
1.0000 g | Freq: Once | INTRAVENOUS | Status: AC
  Administered 2024-10-17: 1 g via INTRAVENOUS
  Filled 2024-10-17: qty 10

## 2024-10-17 MED ORDER — CEPHALEXIN 500 MG PO CAPS: 500.0000 mg | ORAL_CAPSULE | Freq: Three times a day (TID) | ORAL | 0 refills | Status: AC

## 2024-10-17 NOTE — Discharge Instructions (Signed)
 As we discussed please begin taking antibiotics tomorrow 10/18/2024 3 times daily for the next 10 days.  Return to the emergency department for any worsening weakness, if you are unable to keep down antibiotics due to nausea or vomiting or any other symptom personally concerning to yourself.  Otherwise please follow-up with your doctor within the next couple days for recheck/reevaluation.

## 2024-10-17 NOTE — ED Provider Notes (Signed)
 "  [facility] Provider Note    Event Date/Time   First MD Initiated Contact with Patient 10/17/24 1812     (approximate)  History   Chief Complaint: Weakness  HPI  Kristine Le is a 84 y.o. female with a past medical history of CHF, COPD, presents to the emergency department for generalized weakness and chills.  According to the patient since yesterday she has been feeling very weak fatigued experiencing generalized chills.  Other than that patient denies any significant symptoms states she has had a cough but states that is largely chronic occasionally with sputum production.  Denies any nausea vomiting or diarrhea no chest pain or abdominal pain.  No urinary symptoms.  Physical Exam   Triage Vital Signs: ED Triage Vitals  Encounter Vitals Group     BP 10/17/24 1317 121/61     Girls Systolic BP Percentile --      Girls Diastolic BP Percentile --      Boys Systolic BP Percentile --      Boys Diastolic BP Percentile --      Pulse Rate 10/17/24 1317 (!) 106     Resp 10/17/24 1317 18     Temp 10/17/24 1317 (!) 100.4 F (38 C)     Temp Source 10/17/24 1721 Oral     SpO2 10/17/24 1317 93 %     Weight 10/17/24 1319 165 lb (74.8 kg)     Height 10/17/24 1319 5' 7 (1.702 m)     Head Circumference --      Peak Flow --      Pain Score 10/17/24 1315 0     Pain Loc --      Pain Education --      Exclude from Growth Chart --     Most recent vital signs: Vitals:   10/17/24 1317 10/17/24 1721  BP: 121/61 (!) 139/92  Pulse: (!) 106 100  Resp: 18 19  Temp: (!) 100.4 F (38 C) 100 F (37.8 C)  SpO2: 93% 99%    General: Awake, no distress.  CV:  Good peripheral perfusion.  Regular rate and rhythm  Resp:  Normal effort.  Equal breath sounds bilaterally.  Occasional cough. Abd:  No distention.  Soft, nontender.  No rebound or guarding.  Benign abdomen.   ED Results / Procedures / Treatments   EKG  I have reviewed and interpreted the EKG which appears  shows sinus tachycardia 107 bpm with narrow QRS, normal axis, normal intervals, no concerning ST changes.  RADIOLOGY  I have reviewed interpret the chest x-ray images.  No consolidation my evaluation. Radiology is read the x-ray is negative.   MEDICATIONS ORDERED IN ED: Medications  ibuprofen  (ADVIL ) tablet 600 mg (has no administration in time range)  sodium chloride  0.9 % bolus 1,000 mL (1,000 mLs Intravenous New Bag/Given 10/17/24 1836)     IMPRESSION / MDM / ASSESSMENT AND PLAN / ED COURSE  I reviewed the triage vital signs and the nursing notes.  Patient's presentation is most consistent with acute presentation with potential threat to life or bodily function.  Patient presents the emergency department for generalized weakness and chills since yesterday.  Patient found to be febrile to 100.4 in the emergency department pulse rate around 100.  Patient's respiratory panel shows a negative COVID and flu swab.  CBC shows a very slight leukocytosis 12,500 chemistry is normal besides mild dehydration anion gap of 15 we will dose IV fluids.  Will obtain a urine  sample does Motrin  and obtain a chest x-ray.  Symptoms are concerning for possible infection, sources unclear at this time.  Patient's remainder of her lab work has resulted showing a slight leukocytosis reassuring chemistry negative viral panel.  Chest x-ray negative.  We are finally able to get a urine sample from the patient.  Patient appears to have a significant urinary tract infection with many bacteria greater than 50 white blood cells with white blood cell clumps.  Discussed with the patient my recommendation to admit to the hospital for IV antibiotics as she meets sepsis criteria.  Patient is adamant that she be able to go home states she has dogs at home but needs to leave.  States she feels much better after fluids and Motrin .  Patient is agreeable to stay for IV antibiotics in the emergency department we will dose IV Rocephin .   Will discharge on Keflex  3 times daily for 10 days have the patient follow-up with her doctor on Monday.  Patient is agreeable to plan I discussed very strict return precautions as well.  FINAL CLINICAL IMPRESSION(S) / ED DIAGNOSES   Weakness Fever Urinary tract infection  Note:  This document was prepared using Dragon voice recognition software and may include unintentional dictation errors.   Dorothyann Drivers, MD 10/17/24 2119  "

## 2024-10-17 NOTE — ED Triage Notes (Signed)
 Pt comes with weakness and vomiting for several days. Pt states no pain.

## 2024-10-20 LAB — URINE CULTURE: Culture: >=100000 — AB

## 2024-11-06 ENCOUNTER — Encounter: Admitting: Internal Medicine

## 2025-02-21 ENCOUNTER — Ambulatory Visit

## 2025-02-26 ENCOUNTER — Ambulatory Visit
